# Patient Record
Sex: Male | Born: 1955 | Race: White | Hispanic: No | Marital: Single | Smoking: Never smoker
Health system: Southern US, Community
[De-identification: ages and names within clinical notes are randomized; demographics above are authoritative.]

## PROBLEM LIST (undated history)

## (undated) DIAGNOSIS — Z951 Presence of aortocoronary bypass graft: Secondary | ICD-10-CM

## (undated) DIAGNOSIS — N289 Disorder of kidney and ureter, unspecified: Secondary | ICD-10-CM

## (undated) DIAGNOSIS — R079 Chest pain, unspecified: Secondary | ICD-10-CM

## (undated) DIAGNOSIS — I10 Essential (primary) hypertension: Secondary | ICD-10-CM

## (undated) DIAGNOSIS — I2510 Atherosclerotic heart disease of native coronary artery without angina pectoris: Secondary | ICD-10-CM

## (undated) DIAGNOSIS — N183 Chronic kidney disease, stage 3 (moderate): Secondary | ICD-10-CM

## (undated) DIAGNOSIS — C801 Malignant (primary) neoplasm, unspecified: Secondary | ICD-10-CM

## (undated) HISTORY — PX: CARDIAC SURGERY: SHX584

## (undated) HISTORY — DX: Chronic kidney disease, stage 3 (moderate): N18.3

## (undated) HISTORY — DX: Chest pain, unspecified: R07.9

## (undated) HISTORY — DX: Presence of aortocoronary bypass graft: Z95.1

## (undated) HISTORY — DX: Essential (primary) hypertension: I10

## (undated) HISTORY — PX: NEPHRECTOMY: SHX65

---

## 2015-09-01 DIAGNOSIS — I10 Essential (primary) hypertension: Secondary | ICD-10-CM | POA: Diagnosis present

## 2015-09-01 DIAGNOSIS — N183 Chronic kidney disease, stage 3 unspecified: Secondary | ICD-10-CM | POA: Diagnosis present

## 2015-09-01 HISTORY — DX: Chronic kidney disease, stage 3 unspecified: N18.30

## 2015-09-01 HISTORY — DX: Essential (primary) hypertension: I10

## 2016-04-13 ENCOUNTER — Inpatient Hospital Stay (HOSPITAL_COMMUNITY)
Admission: EM | Admit: 2016-04-13 | Discharge: 2016-04-16 | DRG: 287 | Disposition: A | Discharge status: Home / Self Care | Payer: 59 | Attending: Internal Medicine | Admitting: Internal Medicine

## 2016-04-13 ENCOUNTER — Encounter (HOSPITAL_COMMUNITY): Payer: Self-pay | Admitting: Emergency Medicine

## 2016-04-13 ENCOUNTER — Emergency Department (HOSPITAL_COMMUNITY): Payer: 59

## 2016-04-13 DIAGNOSIS — I2510 Atherosclerotic heart disease of native coronary artery without angina pectoris: Secondary | ICD-10-CM | POA: Diagnosis not present

## 2016-04-13 DIAGNOSIS — R079 Chest pain, unspecified: Secondary | ICD-10-CM

## 2016-04-13 DIAGNOSIS — Z951 Presence of aortocoronary bypass graft: Secondary | ICD-10-CM

## 2016-04-13 DIAGNOSIS — I2583 Coronary atherosclerosis due to lipid rich plaque: Secondary | ICD-10-CM | POA: Diagnosis present

## 2016-04-13 DIAGNOSIS — Z7982 Long term (current) use of aspirin: Secondary | ICD-10-CM

## 2016-04-13 DIAGNOSIS — Z85528 Personal history of other malignant neoplasm of kidney: Secondary | ICD-10-CM

## 2016-04-13 DIAGNOSIS — N183 Chronic kidney disease, stage 3 unspecified: Secondary | ICD-10-CM | POA: Diagnosis present

## 2016-04-13 DIAGNOSIS — R7303 Prediabetes: Secondary | ICD-10-CM | POA: Diagnosis present

## 2016-04-13 DIAGNOSIS — Z7902 Long term (current) use of antithrombotics/antiplatelets: Secondary | ICD-10-CM

## 2016-04-13 DIAGNOSIS — Z8249 Family history of ischemic heart disease and other diseases of the circulatory system: Secondary | ICD-10-CM

## 2016-04-13 DIAGNOSIS — E785 Hyperlipidemia, unspecified: Secondary | ICD-10-CM | POA: Diagnosis present

## 2016-04-13 DIAGNOSIS — I208 Other forms of angina pectoris: Secondary | ICD-10-CM

## 2016-04-13 DIAGNOSIS — I25119 Atherosclerotic heart disease of native coronary artery with unspecified angina pectoris: Secondary | ICD-10-CM | POA: Diagnosis not present

## 2016-04-13 DIAGNOSIS — I129 Hypertensive chronic kidney disease with stage 1 through stage 4 chronic kidney disease, or unspecified chronic kidney disease: Secondary | ICD-10-CM | POA: Diagnosis present

## 2016-04-13 DIAGNOSIS — I10 Essential (primary) hypertension: Secondary | ICD-10-CM | POA: Diagnosis present

## 2016-04-13 DIAGNOSIS — Z79899 Other long term (current) drug therapy: Secondary | ICD-10-CM

## 2016-04-13 DIAGNOSIS — R0789 Other chest pain: Secondary | ICD-10-CM | POA: Diagnosis not present

## 2016-04-13 DIAGNOSIS — E669 Obesity, unspecified: Secondary | ICD-10-CM | POA: Diagnosis present

## 2016-04-13 DIAGNOSIS — I200 Unstable angina: Secondary | ICD-10-CM | POA: Diagnosis not present

## 2016-04-13 DIAGNOSIS — Z905 Acquired absence of kidney: Secondary | ICD-10-CM

## 2016-04-13 DIAGNOSIS — N184 Chronic kidney disease, stage 4 (severe): Secondary | ICD-10-CM | POA: Diagnosis present

## 2016-04-13 HISTORY — DX: Atherosclerotic heart disease of native coronary artery without angina pectoris: I25.10

## 2016-04-13 HISTORY — DX: Disorder of kidney and ureter, unspecified: N28.9

## 2016-04-13 HISTORY — DX: Essential (primary) hypertension: I10

## 2016-04-13 HISTORY — DX: Malignant (primary) neoplasm, unspecified: C80.1

## 2016-04-13 HISTORY — DX: Chest pain, unspecified: R07.9

## 2016-04-13 LAB — CBC
HCT: 41.1 % (ref 39.0–52.0)
Hemoglobin: 14 g/dL (ref 13.0–17.0)
MCH: 31.8 pg (ref 26.0–34.0)
MCHC: 34.1 g/dL (ref 30.0–36.0)
MCV: 93.4 fL (ref 78.0–100.0)
PLATELETS: 141 10*3/uL — AB (ref 150–400)
RBC: 4.4 MIL/uL (ref 4.22–5.81)
RDW: 13.5 % (ref 11.5–15.5)
WBC: 6.3 10*3/uL (ref 4.0–10.5)

## 2016-04-13 LAB — BASIC METABOLIC PANEL
ANION GAP: 8 (ref 5–15)
BUN: 30 mg/dL — ABNORMAL HIGH (ref 6–20)
CALCIUM: 9.2 mg/dL (ref 8.9–10.3)
CO2: 24 mmol/L (ref 22–32)
CREATININE: 2.04 mg/dL — AB (ref 0.61–1.24)
Chloride: 108 mmol/L (ref 101–111)
GFR calc non Af Amer: 33 mL/min — ABNORMAL LOW (ref >60)
GFR, EST AFRICAN AMERICAN: 39 mL/min — AB (ref >60)
Glucose, Bld: 140 mg/dL — ABNORMAL HIGH (ref 65–99)
Potassium: 4.4 mmol/L (ref 3.5–5.1)
Sodium: 140 mmol/L (ref 135–145)

## 2016-04-13 LAB — I-STAT TROPONIN, ED: Troponin i, poc: 0.01 ng/mL (ref 0.00–0.08)

## 2016-04-13 LAB — TROPONIN I

## 2016-04-13 MED ORDER — ACETAMINOPHEN 325 MG PO TABS: 650.0000 mg | ORAL_TABLET | ORAL | Status: DC | PRN

## 2016-04-13 MED ORDER — AMLODIPINE BESYLATE 10 MG PO TABS
10.0000 mg | ORAL_TABLET | Freq: Every day | ORAL | Status: DC
  Administered 2016-04-13 – 2016-04-15 (×3): 10 mg via ORAL
  Filled 2016-04-13: qty 2
  Filled 2016-04-13 (×2): qty 1

## 2016-04-13 MED ORDER — ONDANSETRON HCL 4 MG/2ML IJ SOLN: 4.0000 mg | Freq: Four times a day (QID) | INTRAMUSCULAR | Status: DC | PRN

## 2016-04-13 MED ORDER — METOPROLOL TARTRATE 25 MG PO TABS
100.0000 mg | ORAL_TABLET | Freq: Every day | ORAL | Status: DC
  Administered 2016-04-13: 100 mg via ORAL
  Filled 2016-04-13: qty 4

## 2016-04-13 MED ORDER — METOPROLOL TARTRATE 100 MG PO TABS
100.0000 mg | ORAL_TABLET | Freq: Two times a day (BID) | ORAL | Status: DC
  Administered 2016-04-13 – 2016-04-16 (×7): 100 mg via ORAL
  Filled 2016-04-13 (×4): qty 1

## 2016-04-13 MED ORDER — MAGNESIUM OXIDE 400 (241.3 MG) MG PO TABS
400.0000 mg | ORAL_TABLET | Freq: Two times a day (BID) | ORAL | Status: DC
  Administered 2016-04-13 – 2016-04-16 (×7): 400 mg via ORAL
  Filled 2016-04-13 (×7): qty 1

## 2016-04-13 MED ORDER — ENOXAPARIN SODIUM 40 MG/0.4ML ~~LOC~~ SOLN
40.0000 mg | SUBCUTANEOUS | Status: DC
  Administered 2016-04-14 – 2016-04-15 (×2): 40 mg via SUBCUTANEOUS
  Filled 2016-04-13 (×2): qty 0.4

## 2016-04-13 MED ORDER — ASPIRIN 81 MG PO CHEW
81.0000 mg | CHEWABLE_TABLET | Freq: Every day | ORAL | Status: DC
  Administered 2016-04-14 – 2016-04-16 (×2): 81 mg via ORAL
  Filled 2016-04-13 (×2): qty 1

## 2016-04-13 MED ORDER — CLOPIDOGREL BISULFATE 75 MG PO TABS
75.0000 mg | ORAL_TABLET | Freq: Every day | ORAL | Status: DC
  Administered 2016-04-13 – 2016-04-16 (×4): 75 mg via ORAL
  Filled 2016-04-13 (×4): qty 1

## 2016-04-13 MED ORDER — ASPIRIN 325 MG PO TABS: 325.0000 mg | ORAL_TABLET | Freq: Every day | ORAL | Status: DC

## 2016-04-13 MED ORDER — SODIUM CHLORIDE 0.9 % IV BOLUS (SEPSIS)
1000.0000 mL | Freq: Once | INTRAVENOUS | Status: AC
  Administered 2016-04-13: 1000 mL via INTRAVENOUS

## 2016-04-13 MED ORDER — METOPROLOL TARTRATE 100 MG PO TABS
100.0000 mg | ORAL_TABLET | Freq: Two times a day (BID) | ORAL | Status: DC
  Filled 2016-04-13 (×3): qty 1

## 2016-04-13 MED ORDER — GI COCKTAIL ~~LOC~~: 30.0000 mL | Freq: Four times a day (QID) | ORAL | Status: DC | PRN

## 2016-04-13 MED ORDER — LISINOPRIL 10 MG PO TABS
20.0000 mg | ORAL_TABLET | Freq: Every day | ORAL | Status: DC
  Administered 2016-04-13 – 2016-04-14 (×2): 20 mg via ORAL
  Filled 2016-04-13: qty 2
  Filled 2016-04-13: qty 1

## 2016-04-13 NOTE — ED Notes (Signed)
ED Provider at bedside. Pt will go to X-ray after provider has completed assessment

## 2016-04-13 NOTE — Progress Notes (Signed)
04/13/2016  3:13 AM  04/13/2016 4:14 PM  Erik [name] was seen and examined.  The H&P by the admitting provider , orders, imaging was reviewed.  Please see orders.  Will continue to follow.   Murvin Natal, MD [facility]

## 2016-04-13 NOTE — ED Notes (Signed)
Cardiology at bedside.

## 2016-04-13 NOTE — ED Provider Notes (Signed)
[facility] DEPT Provider Note   CSN: [id number] Arrival date & time: 04/13/16  [id number]     History   Chief Complaint Chief Complaint  Patient presents with  . Chest Pain    HPI Erik [name] is a 60 y.o. male.  HPI  60 y.o. male with a hx of CAD, HTN, presents to the Emergency Department today complaining of mid sternal chest discomfort around 2AM. Pt states that he woke up and felt a pressure in his chest that made him "feel off." states associated dizziness and profuse diaphoresis with heart palpitations. No N/V. No direct chest pain or radiation. Pt does recount previous history of quintuple bypass in 2009 where he felt the same way like he does today. Pt notified EMS and was given ASA and NTG with relief of symptoms. No symptoms currently. Does not endorse any URI symptoms. No ABD pain. No fevers. No other symptoms noted.    Past Medical History:  Diagnosis Date  . Cancer ([place])   . Coronary artery disease   . Hypertension   . Renal disorder     There are no active problems to display for this patient.   Past Surgical History:  Procedure Laterality Date  . CARDIAC SURGERY         Home Medications    Prior to Admission medications   Not on File    Family History History reviewed. No pertinent family history.  Social History Social History  Substance Use Topics  . Smoking status: Never Smoker  . Smokeless tobacco: Never Used  . Alcohol use 1.2 oz/week    2 Shots of liquor per week     Allergies   Patient has no known allergies.   Review of Systems Review of Systems ROS reviewed and all are negative for acute change except as noted in the HPI.  Physical Exam Updated Vital Signs BP 155/87 (BP Location: Right Arm)   Pulse 86   Temp 98.4 F (36.9 C) (Oral)   Resp 17   Ht 6\' 1"  (1.854 m)   Wt 127 kg   SpO2 100%   BMI 36.94 kg/m   Physical Exam  Constitutional: He is oriented to person, place, and time. Vital signs are normal. He appears  well-developed and well-nourished.  HENT:  Head: Normocephalic.  Right Ear: Hearing normal.  Left Ear: Hearing normal.  Eyes: Conjunctivae and EOM are normal. Pupils are equal, round, and reactive to light.  Neck: Normal range of motion. Neck supple.  Cardiovascular: Normal rate, regular rhythm, normal heart sounds and intact distal pulses.   Pulmonary/Chest: Effort normal and breath sounds normal.  Abdominal: Soft. There is no tenderness.  Musculoskeletal: Normal range of motion.  Neurological: He is alert and oriented to person, place, and time.  Skin: Skin is warm and dry.  Psychiatric: He has a normal mood and affect. His speech is normal and behavior is normal. Thought content normal.  Nursing note and vitals reviewed.  ED Treatments / Results  Labs (all labs ordered are listed, but only abnormal results are displayed) Labs Reviewed  CBC - Abnormal; Notable for the following:       Result Value   Platelets 141 (*)    All other components within normal limits  BASIC METABOLIC PANEL - Abnormal; Notable for the following:    Glucose, Bld 140 (*)    BUN 30 (*)    Creatinine, Ser 2.04 (*)    GFR calc non Af Amer 33 (*)  GFR calc Af Amer 39 (*)    All other components within normal limits  I-STAT TROPOININ, ED   EKG  EKG Interpretation  Date/Time:  Wednesday April 13 2016 03:23:27 EST Ventricular Rate:  91 PR Interval:    QRS Duration: 103 QT Interval:  366 QTC Calculation: 451 R Axis:   52 Text Interpretation:  Sinus rhythm Multiple ventricular premature complexes Left atrial enlargement Confirmed by Dina Rich  MD, [place] ([id number]) on 04/13/2016 3:53:06 AM       Radiology Dg Chest 2 View  Result Date: 04/13/2016 CLINICAL DATA:  Chest pressure tonight. EXAM: CHEST  2 VIEW COMPARISON:  None. FINDINGS: Mild cardiomegaly. Prior sternotomy and CABG. The lungs are clear. There is no pleural effusion. The pulmonary vasculature is normal. Hilar and mediastinal contours  are unremarkable. IMPRESSION: Cardiomegaly.  No consolidation or effusion. Electronically Signed   By: Andreas Newport M.D.   On: 04/13/2016 04:07    Procedures Procedures (including critical care time)  Medications Ordered in ED Medications - No data to display   Initial Impression / Assessment and Plan / ED Course  I have reviewed the triage vital signs and the nursing notes.  Pertinent labs & imaging results that were available during my care of the patient were reviewed by me and considered in my medical decision making (see chart for details).  Clinical Course    Final Clinical Impressions(s) / ED Diagnoses  {I have reviewed and evaluated the relevant laboratory values. {I have reviewed and evaluated the relevant imaging studies. {I have interpreted the relevant EKG. {I have reviewed the relevant previous healthcare records. {I have reviewed EMS Documentation. {I obtained HPI from historian. {Patient discussed with supervising physician.  ED Course:  Assessment: Pt is a 60yM presents with chest pressure today. Associated diaphoresis and palpitations. Notes similar hx in past in 2009. Has quintuple bypass done. Risk Factors HTN, Obesity, HLD. Concern for cardiac etiology of Chest Pain. Will admit to observation. Heart Score 5. Pt does not meet criteria for CP protocol and a further evaluation is recommended. Pt has been re-evaluated prior to consult and VSS, NAD, heart RRR, pain 0/10, lungs CTAB. No acute abnormalities found on EKG and first round of cardiac enzymes negative.   Disposition/Plan:  Admit Pt acknowledges and agrees with plan  Supervising Physician Merryl Hacker, MD  Final diagnoses:  Chest pain, unspecified type    New Prescriptions New Prescriptions   No medications on file     Shary Decamp, PA-C 04/13/16 0423    Merryl Hacker, MD 04/13/16 [phone]

## 2016-04-13 NOTE — Consult Note (Signed)
CARDIOLOGY CONSULT NOTE   Patient ID: Erik [name] MRN: [id number] DOB/AGE: 1955/11/26 60 y.o.  Admit date: 04/13/2016  Requesting Physician: Dr. Loleta Books  Primary Physician:   Erik Ellis, NP Primary Cardiologist:  Dr. Shirlee More, [place] Reason for Consultation:   Chest pain  HPI: Erik [name] is a 60 y.o. male with a history of CAD s/p CABG (2009), HTN, HLD, obesity, CKD (baseline creat ~2), and RCC s/p R nephrectomy (2007) who presented to [facility] ED with chest pain.   He has a history of CAD s/p LIMA graft to LAD; Vein grafts to Diag, LCX/OM and RCA in 2009 in the setting of what sounds like [place]. Follow-up coronary angiogram in 2010 revealed patent grafts to the left anterior descending and diagonal vessels. Native right coronary artery and circumflex vessels were patent on that study.  He was seen in the ER in 05/2015 for SOB, cough and palpitations. CXR showed mild bilateral pleural effusions . He was started on lasix with improvement. 2D ECHO was orded but I do not see the result. BNP was normal on 06/04/15 and 06/09/15. Patient said he did have echo and it showed that his heart was strong with an EF of 55%. His nephrologist subsequently took him off lasix given CKD.  He was in his usual state of health until last night when he was woken up from sleep around 2 am. He felt very thirsty and just "didn't feel right." He had some chest pressure and dizziness similar to when he presented in 2009, but actually a little more severe. He called EMS. He was given 1 SL NTG with some relief. He is currently chest pain free. 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 No formal exercise. No LE edema, orthopnea or PND. No syncope. No blood in his stool or urine.     Past Medical History:  Diagnosis Date  . Cancer [facility])    Renal cancer s/p Right nephrectomy  . Coronary artery disease   . Hypertension   . Renal disorder      Past Surgical History:  Procedure  Laterality Date  . CARDIAC SURGERY    . NEPHRECTOMY Right     No Known Allergies  I have reviewed the patient's current medications . amLODipine  10 mg Oral Daily  . aspirin  325 mg Oral Daily  . clopidogrel  75 mg Oral Daily  . enoxaparin (LOVENOX) injection  40 mg Subcutaneous Q24H  . lisinopril  20 mg Oral Daily  . magnesium oxide  400 mg Oral BID  . metoprolol  100 mg Oral Daily    acetaminophen, gi cocktail, ondansetron (ZOFRAN) IV  Prior to Admission medications   Medication Sig Start Date End Date Taking? Authorizing Provider  acetaminophen (TYLENOL) 500 MG tablet Take 500-1,000 mg by mouth every 6 (six) hours as needed (for arthritic pain).   Yes Historical Provider, MD  amLODipine (NORVASC) 10 MG tablet Take 10 mg by mouth daily. 09/29/15  Yes Historical Provider, MD  aspirin 325 MG tablet Take 325 mg by mouth daily.   Yes Historical Provider, MD  Cholecalciferol (VITAMIN D3) 2000 units capsule Take 2,000 Units by mouth daily.   Yes Historical Provider, MD  clopidogrel (PLAVIX) 75 MG tablet Take 75 mg by mouth daily. 07/31/15  Yes Historical Provider, MD  lisinopril (PRINIVIL,ZESTRIL) 20 MG tablet Take 20 mg by mouth daily. 09/09/15  Yes Historical Provider, MD  magnesium oxide (MAG-OX) 400 MG  tablet Take 400 mg by mouth 2 (two) times daily.   Yes Historical Provider, MD  metoprolol (LOPRESSOR) 100 MG tablet Take 100 mg by mouth daily. 02/15/16  Yes Historical Provider, MD     Social History   Social History  . Marital status: Single    Spouse name: N/A  . Number of children: N/A  . Years of education: N/A   Occupational History  . Not on file.   Social History Main Topics  . Smoking status: Never Smoker  . Smokeless tobacco: Never Used  . Alcohol use 1.2 oz/week    2 Shots of liquor per week  . Drug use: No  . Sexual activity: Not on file   Other Topics Concern  . Not on file   Social History Narrative  . No narrative on file    Family Status  Relation  Status  . Father   . Brother    Family History  Problem Relation Age of Onset  . Heart attack Father   . Heart attack Brother 42    ROS:  Full 14 point review of systems complete and found to be negative unless listed above.  Physical Exam: Blood pressure 144/82, pulse 73, temperature 98.4 F (36.9 C), temperature source Oral, resp. rate 14, height 6\' 1"  (1.854 m), weight 280 lb (127 kg), SpO2 97 %.  General: Well developed, well nourished, male in no acute distress, obese Head: Eyes PERRLA, No xanthomas.   Normocephalic and atraumatic, oropharynx without edema or exudate.   Lungs: CTAB Heart: HRRR S1 S2, no rub/gallop, Heart regular rate and rhythm with S1, S2  No murmur. pulses are 2+ extrem.   Neck: No carotid bruits. No lymphadenopathy. No  JVD. Abdomen: post nephrectomy on right  Msk:  No spine or cva tenderness. No weakness, no joint deformities or effusions. Extremities: No clubbing or cyanosis.  No LE edema.  Neuro: Alert and oriented X 3. No focal deficits noted. Psych:  Good affect, responds appropriately Skin: No rashes or lesions noted.  Labs:   Lab Results  Component Value Date   WBC 6.3 04/13/2016   HGB 14.0 04/13/2016   HCT 41.1 04/13/2016   MCV 93.4 04/13/2016   PLT 141 (L) 04/13/2016   No results for input(s): INR in the last 72 hours.  Recent Labs Lab 04/13/16 0340  NA 140  K 4.4  CL 108  CO2 24  BUN 30*  CREATININE 2.04*  CALCIUM 9.2  GLUCOSE 140*   No results found for: MG  Recent Labs  04/13/16 1106  [place] <0.03    Recent Labs  04/13/16 0347  TROPIPOC 0.01     ECG:  NSR with PVCs  Radiology:  Dg Chest 2 View  Result Date: 04/13/2016 CLINICAL DATA:  Chest pressure tonight. EXAM: CHEST  2 VIEW COMPARISON:  None. FINDINGS: Mild cardiomegaly. Prior sternotomy and CABG. The lungs are clear. There is no pleural effusion. The pulmonary vasculature is normal. Hilar and mediastinal contours are unremarkable. IMPRESSION:  Cardiomegaly.  No consolidation or effusion. Electronically Signed   By: Andreas Newport M.D.   On: 04/13/2016 04:07    ASSESSMENT AND PLAN:    Principal Problem:   Chest pain Active Problems:   Essential hypertension with goal blood pressure less than 130/85   CKD (chronic kidney disease) stage 3, GFR 30-59 ml/min   Coronary artery disease due to lipid rich plaque  Adren Bissonnette is a 60 y.o. male with a history of CAD s/p CABG (  2009), HTN, HLD, obesity, CKD (baseline creat ~2) and RCC s/p R nephrectomy (2007) who presented to [facility] ED with chest pain.   Chest pain: troponin neg x1. ECG with no acute ST/TW changes. With his CKD with creat around 2, plan will be to start with exercise myoview tomorrow AM. Will hold BB and make NPO after midnight.   CAD s/p CABG: continue ASA (decrease from 325mg  --> 81mg )/plavix, statin and BB. Will increase lopressor to 100mg  BID (only taking once daily?) after stress test.   HLD: LDL 102 in 5/017. Continue Crestor  Pre diabetes: Hg A1c 6.4 in 08/2015.  CKD s/p nephrectomy: creat at baseline. Will need to hydrate if he needs a cath   HTN: continue amlodipine, lisinopril and lopressor for now. Hold BB for stress test in AM. If he needs cath we will need to hold lisinopril as well.   Signed: Angelena Form, PA-C 04/13/2016 1:05 PM  Pager [phone]  Co-Sign MD  Patient examined chart reviewed. Obese white male post sternotomy Lungs clear no murmur or edema Atypical pain with negative troponin and no ECG changes Given nephrectomy and Cr 2 like to avoid Contrast/cath Patient thinks he can walk on treadmill Will order exercise myovue in am and only consider Cath for high risk scan.  Continue ASA increase lopressor  NPO in am  Jenkins Rouge

## 2016-04-13 NOTE — ED Notes (Signed)
Attempted report 

## 2016-04-13 NOTE — H&P (Signed)
History and Physical  Patient Name: Erik [name]     QZE:[id number]    DOB: [birth year]    DOA: 04/13/2016 PCP: Alfonso Ellis, NP  Cardiologist: Dr. Shirlee More, [place]    Patient coming from: Home     Chief Complaint: Chest pain  HPI: Sutter Ahlgren is a 60 y.o. 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  Patient was in his usual state of health until tonight at 2 AM, he woke up "not feeling right". He got up and sat at his desk, and then had onset of "heart pounding", then started to be diaphoretic, felt a pressure in his chest, was breathing fast, and felt like he was dizzy and going to pass out. HIS very similar to the episode he had back in 2009 pound and be evaluated before his CABG (he thinks this was not a "heart attack" per se though).  He did have two whiskey cokes before bed tonight, which is unusual for him.  He called 9-1-1 and took aspirin 325 mg twice and shortly after that, his pain resolved (not after nitro as noted in other notes).  ED course: -Afebrile, heart rate 86, respirations and pulse ox normal, blood pressure 155/87 -Initial ECG showed sinus tachycardia wtihout ST changes, rare PVCs and troponin was negative. -Na 140, K 4.4, Cr 2.04 (baseline 2.0), WBC 6.3, Hgb 14 -CXR showed cardiomegaly without airspace disease or edema -TRH was asked to admit for observation, serial troponins and risk stratification.   Risk factors: Existing premature coronary disease, premature CAD in family, HTN, hyperlipidemia intolerant of statins.   No DM or smoking.      Review of Systems:  Review of Systems  Constitutional: Positive for diaphoresis.  Respiratory: Positive for shortness of breath.   Cardiovascular: Positive for chest pain and palpitations.  Neurological: Positive for dizziness.  Psychiatric/Behavioral: The patient is nervous/anxious.   All other  systems reviewed and are negative.    Past Medical History:  Diagnosis Date  . Cancer [facility])    Renal cancer s/p Right nephrectomy  . Coronary artery disease   . Hypertension   . Renal disorder     Past Surgical History:  Procedure Laterality Date  . CARDIAC SURGERY    . NEPHRECTOMY Right     Social History: Patient lives alone.  Patient walks unassisted.  [place] on [place]  Drives a motorcycle.  Never smoker.  Drinks rarely.    No Known Allergies  Family history: family history includes Heart attack in his father; Heart attack (age of onset: 40) in his brother.  Prior to Admission medications   Not on File       Physical Exam: BP 166/97   Pulse 80   Temp 98.4 F (36.9 C) (Oral)   Resp 17   Ht 6' 1"  (1.854 m)   Wt 127 kg (280 lb)   SpO2 99%   BMI 36.94 kg/m  General appearance: Well-developed, adult male, alert and in no acute distress.   Eyes: Anicteric, conjunctiva pink, lids and lashes normal.     ENT: No nasal deformity, discharge, or epistaxis.  OP moist without lesions.  Dentures. Skin: Warm and dry.   Cardiac: RRR, nl S1-S2, no murmurs appreciated.  Capillary refill is brisk.  JVP not visible.  No LE edema.  Radial and DP pulses 2+ and symmetric.  No carotid  bruits. Respiratory: Normal respiratory rate and rhythm.  CTAB without rales or wheezes. GI: Abdomen soft without rigidity.  No TTP. No ascites, distension.   MSK: No deformities or effusions.   Pain not reproduced with palpation of precordium.  No pain with arm movement. Neuro: Sensorium intact and responding to questions, attention normal.  Speech is fluent.  Moves all extremities equally and with normal coordination.    Psych: Behavior appropriate.  Affect normal.  No evidence of aural or visual hallucinations or delusions.       Labs on Admission:  The metabolic panel shows chronic renal insufficiency, at baseline Cr 2.0, normal electrolytes. The complete blood count shows no  leukocytosis anemia or thrombocytopenia. The initial troponin is negative.  Radiological Exams on Admission: Personally reviewed CXR shows CM and no airspace disease: Dg Chest 2 View  Result Date: 04/13/2016 CLINICAL DATA:  Chest pressure tonight. EXAM: CHEST  2 VIEW COMPARISON:  None. FINDINGS: Mild cardiomegaly. Prior sternotomy and CABG. The lungs are clear. There is no pleural effusion. The pulmonary vasculature is normal. Hilar and mediastinal contours are unremarkable. IMPRESSION: Cardiomegaly.  No consolidation or effusion. Electronically Signed   By: Andreas Newport M.D.   On: 04/13/2016 04:07    EKG: Independently reviewed. Rate 91, QTc, 451, a few PVCs.  No ST changes.    Assessment/Plan  1. Chest pain: This is new.  The patient has HEART score of 4. Symptoms are similar to a previous episode before this CABG.  Other potential causes of chest pain (PE, dissection, pancreatitis, pneumonia/effusion, pericarditis) are doubted.  Intense palpitations and alcohol use perhaps were all from symptomatic PVCs?   -Serial troponins are ordered -Telemetry -Consult to cardiology, appreciate recommendations    2. HTN and CAD secondary prevention:  Intolerant to statins, including every other day dosing.  Never tried pravastatin. -Continue lisinopril, BB, and amlodipine -Continue aspirin 325 mg and Plavix  3. CKD III-IV:  Baseline Cr 2.0, eGFR 33. Stable.  4. Other medications:  -Continue magnesium            DVT prophylaxis: Lovenox Diet: NPO after 4am for anticipated stress testing Code Status: FULL  Family Communication: None present  Disposition Plan: Anticipate overnight observation for arrhythmia on telemetry, serial troponins and subsequent risk stratification by Cardiology.  If testing negative, home after. Consults called: Cardiology, via Inbasket Admission status: Telemetry, OBS   Medical decision making: Patient seen at 5:10 AM on 04/13/2016.  The  patient was discussed with Shary Decamp, PA-C. What exists of the patient's chart from outside records at [facility] was reviewed in depth.  Clinical condition: stable.      Edwin Dada [facility] Pager [phone]

## 2016-04-13 NOTE — ED Triage Notes (Signed)
Per EMS, pt woke up to get a drink of water, began to have midsternal non-radiating chest pain.  He became diaphoric, SOB, felt as if he was "going to pass out" and describes his heart as "pounding away".  Pt took 650 of ASA and called for EMS.  He was given a nitro in route which took his pressure from "bad" to "gone".  Extensive cardiac hx.

## 2016-04-14 ENCOUNTER — Observation Stay (HOSPITAL_COMMUNITY): Payer: 59

## 2016-04-14 DIAGNOSIS — R079 Chest pain, unspecified: Secondary | ICD-10-CM

## 2016-04-14 DIAGNOSIS — R0789 Other chest pain: Secondary | ICD-10-CM | POA: Diagnosis not present

## 2016-04-14 DIAGNOSIS — I209 Angina pectoris, unspecified: Secondary | ICD-10-CM | POA: Diagnosis not present

## 2016-04-14 DIAGNOSIS — I2510 Atherosclerotic heart disease of native coronary artery without angina pectoris: Secondary | ICD-10-CM | POA: Diagnosis not present

## 2016-04-14 DIAGNOSIS — N183 Chronic kidney disease, stage 3 (moderate): Secondary | ICD-10-CM | POA: Diagnosis not present

## 2016-04-14 DIAGNOSIS — I10 Essential (primary) hypertension: Secondary | ICD-10-CM | POA: Diagnosis not present

## 2016-04-14 LAB — NM MYOCAR MULTI W/SPECT W/WALL MOTION / EF
CHL CUP NUCLEAR SDS: 8
CHL CUP NUCLEAR SRS: 4
CHL CUP NUCLEAR SSS: 12
CSEPPHR: 116 {beats}/min
Estimated workload: 1 METS
LHR: 0.46
LV sys vol: 80 mL
LVDIAVOL: 153 mL (ref 62–150)
MPHR: 159 {beats}/min
NUC STRESS TID: 1.15
Percent HR: 72 %
Rest HR: 66 {beats}/min

## 2016-04-14 LAB — PROTIME-INR
INR: 0.94
Prothrombin Time: 12.5 seconds (ref 11.4–15.2)

## 2016-04-14 MED ORDER — REGADENOSON 0.4 MG/5ML IV SOLN
INTRAVENOUS | Status: AC
  Filled 2016-04-14: qty 5

## 2016-04-14 MED ORDER — SODIUM CHLORIDE 0.9% FLUSH: 3.0000 mL | INTRAVENOUS | Status: DC | PRN

## 2016-04-14 MED ORDER — SODIUM CHLORIDE 0.9 % IV SOLN: INTRAVENOUS | Status: DC

## 2016-04-14 MED ORDER — REGADENOSON 0.4 MG/5ML IV SOLN
0.4000 mg | Freq: Once | INTRAVENOUS | Status: AC
  Administered 2016-04-14: 0.4 mg via INTRAVENOUS
  Filled 2016-04-14: qty 5

## 2016-04-14 MED ORDER — SODIUM CHLORIDE 0.9 % IV SOLN
INTRAVENOUS | Status: DC
  Administered 2016-04-14: 21:00:00 via INTRAVENOUS

## 2016-04-14 MED ORDER — ALPRAZOLAM 0.5 MG PO TABS
0.5000 mg | ORAL_TABLET | Freq: Three times a day (TID) | ORAL | Status: DC | PRN
  Administered 2016-04-14 – 2016-04-15 (×2): 0.5 mg via ORAL
  Filled 2016-04-14 (×2): qty 1

## 2016-04-14 MED ORDER — TECHNETIUM TC 99M TETROFOSMIN IV KIT
10.0000 | PACK | Freq: Once | INTRAVENOUS | Status: AC | PRN
  Administered 2016-04-14: 10 via INTRAVENOUS

## 2016-04-14 MED ORDER — ASPIRIN 81 MG PO CHEW
324.0000 mg | CHEWABLE_TABLET | ORAL | Status: AC
  Administered 2016-04-15: 324 mg via ORAL
  Filled 2016-04-14: qty 4

## 2016-04-14 MED ORDER — SODIUM CHLORIDE 0.9% FLUSH
3.0000 mL | Freq: Two times a day (BID) | INTRAVENOUS | Status: DC
  Administered 2016-04-14 – 2016-04-15 (×2): 3 mL via INTRAVENOUS

## 2016-04-14 MED ORDER — TECHNETIUM TC 99M TETROFOSMIN IV KIT
30.0000 | PACK | Freq: Once | INTRAVENOUS | Status: AC | PRN
  Administered 2016-04-14: 30 via INTRAVENOUS

## 2016-04-14 NOTE — Progress Notes (Addendum)
The patient was seen in nuclear medicine for an [employer]. He tolerated the procedure well. No acute ST or TW changes on ECG.   Switched from treadmill to [place], NP  [place]

## 2016-04-14 NOTE — Progress Notes (Signed)
Long discussion with patient High risk myovue with TID and anterior anterior lateral apical ischemia  Reviewed notes her brought Had cath after CABG in 2010  LIMA patent to LAD SVG D1 40% SVG RCA occluded SVG OM1/OM3 sequential occluded  Started hydrating 3:30 today PA to write pre cath orders including bicarb Cath in am   Interventionalist will decide if left wrist or RFA to minimize dye Needs echo for EF had one 06/2015 Mikel Cella but cannot find  Results in care everywhere  Jenkins Rouge

## 2016-04-14 NOTE — Progress Notes (Signed)
Patient Name: Erik [name] Date of Encounter: 04/14/2016  Primary Cardiologist: Dr. Shirlee More, [facility] Problem List     Principal Problem:   Chest pain Active Problems:   Essential hypertension with goal blood pressure less than 130/85   CKD (chronic kidney disease) stage 3, GFR 30-59 ml/min   Coronary artery disease due to lipid rich plaque     Subjective   Feels well, denies chest pain and SOB. Seen in nuclear med  Inpatient Medications    Scheduled Meds: . amLODipine  10 mg Oral Daily  . aspirin  81 mg Oral Daily  . clopidogrel  75 mg Oral Daily  . enoxaparin (LOVENOX) injection  40 mg Subcutaneous Q24H  . lisinopril  20 mg Oral Daily  . magnesium oxide  400 mg Oral BID  . metoprolol  100 mg Oral BID  . metoprolol  100 mg Oral BID  . regadenoson  0.4 mg Intravenous Once   Continuous Infusions:  PRN Meds: acetaminophen, gi cocktail, ondansetron (ZOFRAN) IV   Vital Signs    Vitals:   04/13/16 1412 04/13/16 2025 04/14/16 0550 04/14/16 0850  BP: (!) 150/91 (!) 153/84 123/66 132/79  Pulse: 68 65 68   Resp:  20 18   Temp:  97.8 F (36.6 C) 98.4 F (36.9 C)   TempSrc:  Oral Oral   SpO2: 97% 98% 95%   Weight:      Height:        Intake/Output Summary (Last 24 hours) at 04/14/16 0916 Last data filed at 04/14/16 0851  Gross per 24 hour  Intake                0 ml  Output                0 ml  Net                0 ml   Filed Weights   04/13/16 0331  Weight: 280 lb (127 kg)    Physical Exam   GEN: Well nourished, well developed, in no acute distress.  HEENT: Grossly normal.  Neck: Supple, no JVD, carotid bruits, or masses. Cardiac: RRR, no murmurs, rubs, or gallops. No clubbing, cyanosis, edema.  Radials/DP/PT 2+ and equal bilaterally.  Respiratory:  Respirations regular and unlabored, clear to auscultation bilaterally. GI: Soft, nontender, nondistended, BS + x 4. MS: no deformity or atrophy. Skin: warm and dry, no rash. Neuro:   Strength and sensation are intact. Psych: AAOx3.  Normal affect.  Labs    CBC  Recent Labs  04/13/16 0340  WBC 6.3  HGB 14.0  HCT 41.1  MCV 93.4  PLT [id number]*   Basic Metabolic Panel  Recent Labs  04/13/16 0340  NA 140  K 4.4  CL 108  CO2 24  GLUCOSE 140*  BUN 30*  CREATININE 2.04*  CALCIUM 9.2    Cardiac Enzymes  Recent Labs  04/13/16 1106 04/13/16 1442  TROPONINI <0.03 <0.03     Telemetry    NSR - Personally Reviewed  ECG    NSR - Personally Reviewed  Radiology    Dg Chest 2 View  Result Date: 04/13/2016 CLINICAL DATA:  Chest pressure tonight. EXAM: CHEST  2 VIEW COMPARISON:  None. FINDINGS: Mild cardiomegaly. Prior sternotomy and CABG. The lungs are clear. There is no pleural effusion. The pulmonary vasculature is normal. Hilar and mediastinal contours are unremarkable. IMPRESSION: Cardiomegaly.  No consolidation or effusion. Electronically Signed   By: Quillian Quince  Armandina Stammer M.D.   On: 04/13/2016 04:07      Patient Profile  Erik [name] is a 60 y.o. male with a history of CAD s/p CABG (2009), HTN, HLD, obesity, CKD (baseline creat ~2) and RCC s/p R nephrectomy (2007) who presented to [facility] ED with chest pain.      Assessment & Plan    Chest pain: troponin neg x3. ECG with no acute ST/TW changes. With his CKD with creat around 2, risk Stratification with myovue today will need hydration if cath planned for positive scan  CAD s/p CABG: continue ASA (decrease from 325mg  --> 81mg )/plavix, statin and BB. Will increase lopressor to 100mg  BID (only taking once daily?) after stress test.   HLD: LDL 102 in 5/017. Continue Crestor  Pre diabetes: Hg A1c 6.4 in 08/2015.  CKD s/p nephrectomy: creat at baseline. Will need to hydrate if he needs a cath   HTN: continue amlodipine, lisinopril and lopressor for now.    Jenkins Rouge

## 2016-04-14 NOTE — Progress Notes (Signed)
PROGRESS NOTE   Erik [name]  [id number]  DOB: June 02, 1955  DOA: 04/13/2016 PCP: Alfonso Ellis, NP  Hospital course:  Erik [name] is a 60 y.o. 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  Assessment & Plan:   1. Chest pain: This is new.  The patient has HEART score of 4. Symptoms are similar to a previous episode before this CABG.  Other potential causes of chest pain (PE, dissection, pancreatitis, pneumonia/effusion, pericarditis) are doubted.  Intense palpitations and alcohol use perhaps were all from symptomatic PVCs?   -Serial troponins are negative, stress myoview intermediate -Telemetry monitoring -Consult to cardiology, appreciate recommendations  2. HTN and CAD secondary prevention:  Intolerant to statins, including every other day dosing.  Never tried pravastatin. -Continue lisinopril, BB, and amlodipine -Continue aspirin 325 mg and Plavix  3. CKD III-IV:  Baseline Cr 2.0, eGFR 33. Stable.  4. Other medications:  -Continue magnesium  DVT prophylaxis: Lovenox Code Status: FULL  Family Communication: None present  Disposition Plan: Home  Consults called: Cardiology, via Inbasket  Subjective: Pt reports no more chest pain  Objective: Vitals:   04/14/16 0926 04/14/16 0928 04/14/16 0930 04/14/16 1151  BP: (!) 150/75 140/78 (!) 141/73 133/83  Pulse:    76  Resp:    18  Temp:    98.4 F (36.9 C)  TempSrc:    Oral  SpO2:    96%  Weight:      Height:        Intake/Output Summary (Last 24 hours) at 04/14/16 1451 Last data filed at 04/14/16 0851  Gross per 24 hour  Intake                0 ml  Output                0 ml  Net                0 ml   Filed Weights   04/13/16 0331  Weight: 127 kg (280 lb)    Exam:  General exam: awake, alert, no distress, cooperative.  Respiratory system: Clear. No increased work of  breathing. Cardiovascular system: S1 & S2 heard. No JVD, murmurs, gallops, clicks or pedal edema. Gastrointestinal system: Abdomen is nondistended, soft and nontender. Normal bowel sounds heard. Central nervous system: Alert and oriented. No focal neurological deficits. Extremities: no CCE.  Data Reviewed: Basic Metabolic Panel:  Recent Labs Lab 04/13/16 0340  NA 140  K 4.4  CL 108  CO2 24  GLUCOSE 140*  BUN 30*  CREATININE 2.04*  CALCIUM 9.2   Liver Function Tests: No results for input(s): AST, ALT, ALKPHOS, BILITOT, PROT, ALBUMIN in the last 168 hours. No results for input(s): LIPASE, AMYLASE in the last 168 hours. No results for input(s): AMMONIA in the last 168 hours. CBC:  Recent Labs Lab 04/13/16 0340  WBC 6.3  HGB 14.0  HCT 41.1  MCV 93.4  PLT 141*   Cardiac Enzymes:  Recent Labs Lab 04/13/16 1106 04/13/16 1442  TROPONINI <0.03 <0.03   CBG (last 3)  No results for input(s): GLUCAP in the last 72 hours. No results found for this or any previous visit (from the past 240 hour(s)).   Studies: Dg Chest 2 View  Result Date: 04/13/2016 CLINICAL DATA:  Chest pressure tonight. EXAM: CHEST  2 VIEW COMPARISON:  None. FINDINGS: Mild  cardiomegaly. Prior sternotomy and CABG. The lungs are clear. There is no pleural effusion. The pulmonary vasculature is normal. Hilar and mediastinal contours are unremarkable. IMPRESSION: Cardiomegaly.  No consolidation or effusion. Electronically Signed   By: Andreas Newport M.D.   On: 04/13/2016 04:07   Nm Myocar Multi W/spect W/wall Motion / Ef  Result Date: 04/14/2016  Horizontal ST segment depression ST segment depression of 3 mm was noted during stress in the V4, V5, V6 and V3 leads, beginning at 1 minutes of stress, ending at 5 minutes of stress, and returning to baseline after less than 1 minute of recovery.  Defect 1: There is a large defect of moderate severity.  Findings consistent with ischemia.  This is an  intermediate risk study.  Nuclear stress EF: 47%.  Large size, moderate severity revesrible anterior, anteroapical, anterolateral and anteroseptal wall defect suggestive of ischemia. LVEF 47% with global hypokinesis. This is an intermediate risk study.   Scheduled Meds: . regadenoson      . amLODipine  10 mg Oral Daily  . aspirin  81 mg Oral Daily  . clopidogrel  75 mg Oral Daily  . enoxaparin (LOVENOX) injection  40 mg Subcutaneous Q24H  . lisinopril  20 mg Oral Daily  . magnesium oxide  400 mg Oral BID  . metoprolol  100 mg Oral BID  . metoprolol  100 mg Oral BID   Principal Problem:   Chest pain Active Problems:   Essential hypertension with goal blood pressure less than 130/85   CKD (chronic kidney disease) stage 3, GFR 30-59 ml/min   Coronary artery disease due to lipid rich plaque  Time spent:   Irwin Brakeman, MD, FAAFP [facility] Pager [phone]  If 7PM-7AM, please contact night-coverage [email or web address] Password [email or web address] 04/14/2016, 2:51 PM    LOS: 0 days

## 2016-04-15 ENCOUNTER — Observation Stay (HOSPITAL_BASED_OUTPATIENT_CLINIC_OR_DEPARTMENT_OTHER): Payer: 59

## 2016-04-15 ENCOUNTER — Encounter (HOSPITAL_COMMUNITY)
Admission: EM | Disposition: A | Discharge status: Home / Self Care | Payer: Self-pay | Source: Home / Self Care | Attending: Family Medicine

## 2016-04-15 DIAGNOSIS — I2583 Coronary atherosclerosis due to lipid rich plaque: Secondary | ICD-10-CM | POA: Diagnosis present

## 2016-04-15 DIAGNOSIS — R079 Chest pain, unspecified: Secondary | ICD-10-CM

## 2016-04-15 DIAGNOSIS — Z7902 Long term (current) use of antithrombotics/antiplatelets: Secondary | ICD-10-CM | POA: Diagnosis not present

## 2016-04-15 DIAGNOSIS — I25119 Atherosclerotic heart disease of native coronary artery with unspecified angina pectoris: Secondary | ICD-10-CM | POA: Diagnosis present

## 2016-04-15 DIAGNOSIS — I200 Unstable angina: Secondary | ICD-10-CM | POA: Diagnosis not present

## 2016-04-15 DIAGNOSIS — I2510 Atherosclerotic heart disease of native coronary artery without angina pectoris: Secondary | ICD-10-CM | POA: Diagnosis not present

## 2016-04-15 DIAGNOSIS — N184 Chronic kidney disease, stage 4 (severe): Secondary | ICD-10-CM | POA: Diagnosis present

## 2016-04-15 DIAGNOSIS — I209 Angina pectoris, unspecified: Secondary | ICD-10-CM

## 2016-04-15 DIAGNOSIS — I10 Essential (primary) hypertension: Secondary | ICD-10-CM | POA: Diagnosis not present

## 2016-04-15 DIAGNOSIS — Z7982 Long term (current) use of aspirin: Secondary | ICD-10-CM | POA: Diagnosis not present

## 2016-04-15 DIAGNOSIS — E785 Hyperlipidemia, unspecified: Secondary | ICD-10-CM

## 2016-04-15 DIAGNOSIS — I129 Hypertensive chronic kidney disease with stage 1 through stage 4 chronic kidney disease, or unspecified chronic kidney disease: Secondary | ICD-10-CM | POA: Diagnosis present

## 2016-04-15 DIAGNOSIS — R7303 Prediabetes: Secondary | ICD-10-CM | POA: Diagnosis present

## 2016-04-15 DIAGNOSIS — Z85528 Personal history of other malignant neoplasm of kidney: Secondary | ICD-10-CM | POA: Diagnosis not present

## 2016-04-15 DIAGNOSIS — Z8249 Family history of ischemic heart disease and other diseases of the circulatory system: Secondary | ICD-10-CM | POA: Diagnosis not present

## 2016-04-15 DIAGNOSIS — N183 Chronic kidney disease, stage 3 (moderate): Secondary | ICD-10-CM | POA: Diagnosis not present

## 2016-04-15 DIAGNOSIS — Z951 Presence of aortocoronary bypass graft: Secondary | ICD-10-CM | POA: Diagnosis not present

## 2016-04-15 DIAGNOSIS — Z79899 Other long term (current) drug therapy: Secondary | ICD-10-CM | POA: Diagnosis not present

## 2016-04-15 DIAGNOSIS — E669 Obesity, unspecified: Secondary | ICD-10-CM | POA: Diagnosis present

## 2016-04-15 DIAGNOSIS — Z905 Acquired absence of kidney: Secondary | ICD-10-CM | POA: Diagnosis not present

## 2016-04-15 HISTORY — PX: CARDIAC CATHETERIZATION: SHX172

## 2016-04-15 LAB — ECHOCARDIOGRAM COMPLETE
EERAT: 6.49
EWDT: 254 ms
FS: 39 % (ref 28–44)
Height: 73 in
IV/PV OW: 0.97
LA ID, A-P, ES: 52 mm
LA diam index: 2.11 cm/m2
LAVOL: 73.5 mL
LAVOLA4C: 61.3 mL
LAVOLIN: 29.9 mL/m2
LEFT ATRIUM END SYS DIAM: 52 mm
LV E/e'average: 6.49
LV PW d: 14.8 mm — AB (ref 0.6–1.1)
LV TDI E'MEDIAL: 6.09
LV e' LATERAL: 10.7 cm/s
LVEEMED: 6.49
LVOT area: 4.91 cm2
LVOT diameter: 25 mm
Lateral S' vel: 9.25 cm/s
MV Dec: 254
MV pk E vel: 69.4 m/s
MVPKAVEL: 66.5 m/s
RV TAPSE: 14.1 mm
Reg peak vel: 225 cm/s
TDI e' lateral: 10.7
TR max vel: 225 cm/s
Weight: 4368 oz

## 2016-04-15 LAB — CBC
HCT: 46 % (ref 39.0–52.0)
Hemoglobin: 15.8 g/dL (ref 13.0–17.0)
MCH: 32.7 pg (ref 26.0–34.0)
MCHC: 34.3 g/dL (ref 30.0–36.0)
MCV: 95.2 fL (ref 78.0–100.0)
PLATELETS: 156 10*3/uL (ref 150–400)
RBC: 4.83 MIL/uL (ref 4.22–5.81)
RDW: 13.6 % (ref 11.5–15.5)
WBC: 7.3 10*3/uL (ref 4.0–10.5)

## 2016-04-15 LAB — LIPID PANEL
CHOL/HDL RATIO: 5.4 ratio
Cholesterol: 214 mg/dL — ABNORMAL HIGH (ref 0–200)
HDL: 40 mg/dL — ABNORMAL LOW (ref >40)
LDL CALC: 117 mg/dL — AB (ref 0–99)
Triglycerides: 283 mg/dL — ABNORMAL HIGH (ref <150)
VLDL: 57 mg/dL — AB (ref 0–40)

## 2016-04-15 LAB — BASIC METABOLIC PANEL
Anion gap: 6 (ref 5–15)
BUN: 22 mg/dL — AB (ref 6–20)
CALCIUM: 9.6 mg/dL (ref 8.9–10.3)
CO2: 27 mmol/L (ref 22–32)
CREATININE: 1.9 mg/dL — AB (ref 0.61–1.24)
Chloride: 105 mmol/L (ref 101–111)
GFR calc Af Amer: 42 mL/min — ABNORMAL LOW (ref >60)
GFR, EST NON AFRICAN AMERICAN: 36 mL/min — AB (ref >60)
Glucose, Bld: 97 mg/dL (ref 65–99)
Potassium: 4.7 mmol/L (ref 3.5–5.1)
SODIUM: 138 mmol/L (ref 135–145)

## 2016-04-15 LAB — PLATELET INHIBITION P2Y12: PLATELET FUNCTION P2Y12: 183 [PRU] — AB (ref 194–418)

## 2016-04-15 SURGERY — LEFT HEART CATH AND CORS/GRAFTS ANGIOGRAPHY
Anesthesia: LOCAL

## 2016-04-15 MED ORDER — CLOPIDOGREL BISULFATE 75 MG PO TABS: 75.0000 mg | ORAL_TABLET | Freq: Every day | ORAL | Status: DC

## 2016-04-15 MED ORDER — MORPHINE SULFATE (PF) 2 MG/ML IV SOLN: 2.0000 mg | INTRAVENOUS | Status: DC | PRN

## 2016-04-15 MED ORDER — MIDAZOLAM HCL 2 MG/2ML IJ SOLN
INTRAMUSCULAR | Status: DC | PRN
  Administered 2016-04-15: 1 mg via INTRAVENOUS

## 2016-04-15 MED ORDER — SODIUM CHLORIDE 0.9 % IV SOLN: 250.0000 mL | INTRAVENOUS | Status: DC | PRN

## 2016-04-15 MED ORDER — SODIUM CHLORIDE 0.9% FLUSH: 3.0000 mL | INTRAVENOUS | Status: DC | PRN

## 2016-04-15 MED ORDER — HEPARIN (PORCINE) IN NACL 2-0.9 UNIT/ML-% IJ SOLN
INTRAMUSCULAR | Status: DC | PRN
  Administered 2016-04-15: 1000 mL

## 2016-04-15 MED ORDER — IOPAMIDOL (ISOVUE-370) INJECTION 76%
INTRAVENOUS | Status: DC | PRN
  Administered 2016-04-15: 70 mL via INTRA_ARTERIAL

## 2016-04-15 MED ORDER — FENTANYL CITRATE (PF) 100 MCG/2ML IJ SOLN
INTRAMUSCULAR | Status: AC
  Filled 2016-04-15: qty 2

## 2016-04-15 MED ORDER — MIDAZOLAM HCL 2 MG/2ML IJ SOLN
INTRAMUSCULAR | Status: AC
  Filled 2016-04-15: qty 2

## 2016-04-15 MED ORDER — ASPIRIN 81 MG PO CHEW: 81.0000 mg | CHEWABLE_TABLET | Freq: Every day | ORAL | Status: DC

## 2016-04-15 MED ORDER — ACETAMINOPHEN 325 MG PO TABS: 650.0000 mg | ORAL_TABLET | ORAL | Status: DC | PRN

## 2016-04-15 MED ORDER — ONDANSETRON HCL 4 MG/2ML IJ SOLN: 4.0000 mg | Freq: Four times a day (QID) | INTRAMUSCULAR | Status: DC | PRN

## 2016-04-15 MED ORDER — LIDOCAINE HCL (PF) 1 % IJ SOLN
INTRAMUSCULAR | Status: DC | PRN
  Administered 2016-04-15: 15 mL

## 2016-04-15 MED ORDER — LIDOCAINE HCL (PF) 1 % IJ SOLN
INTRAMUSCULAR | Status: AC
  Filled 2016-04-15: qty 30

## 2016-04-15 MED ORDER — FENTANYL CITRATE (PF) 100 MCG/2ML IJ SOLN
INTRAMUSCULAR | Status: DC | PRN
  Administered 2016-04-15: 25 ug via INTRAVENOUS

## 2016-04-15 MED ORDER — SODIUM CHLORIDE 0.9 % IV SOLN: INTRAVENOUS | Status: AC

## 2016-04-15 MED ORDER — SODIUM CHLORIDE 0.9% FLUSH
3.0000 mL | Freq: Two times a day (BID) | INTRAVENOUS | Status: DC
  Administered 2016-04-15: 3 mL via INTRAVENOUS

## 2016-04-15 MED ORDER — SODIUM BICARBONATE 8.4 % IV SOLN
INTRAVENOUS | Status: AC
  Administered 2016-04-15: 15:00:00 via INTRAVENOUS
  Filled 2016-04-15 (×2): qty 500

## 2016-04-15 MED ORDER — IOPAMIDOL (ISOVUE-370) INJECTION 76%
INTRAVENOUS | Status: AC
Start: 2016-04-15 — End: 2016-04-15
  Filled 2016-04-15: qty 100

## 2016-04-15 MED ORDER — SODIUM BICARBONATE BOLUS VIA INFUSION
INTRAVENOUS | Status: AC
  Administered 2016-04-15: 75 meq via INTRAVENOUS
  Filled 2016-04-15: qty 1

## 2016-04-15 MED ORDER — HEPARIN (PORCINE) IN NACL 2-0.9 UNIT/ML-% IJ SOLN
INTRAMUSCULAR | Status: AC
  Filled 2016-04-15: qty 1000

## 2016-04-15 MED ORDER — SODIUM BICARBONATE 8.4 % IV SOLN
INTRAVENOUS | Status: AC
  Administered 2016-04-15: 20:00:00 via INTRAVENOUS
  Filled 2016-04-15 (×2): qty 1000

## 2016-04-15 SURGICAL SUPPLY — 7 items
CATH INFINITI 5FR MULTPACK ANG (CATHETERS) ×2 IMPLANT
HOVERMATT SINGLE USE (MISCELLANEOUS) ×2 IMPLANT
KIT HEART LEFT (KITS) ×2 IMPLANT
PACK CARDIAC CATHETERIZATION (CUSTOM PROCEDURE TRAY) ×2 IMPLANT
SHEATH PINNACLE 5F 10CM (SHEATH) ×2 IMPLANT
TRANSDUCER W/STOPCOCK (MISCELLANEOUS) ×2 IMPLANT
WIRE EMERALD 3MM-J .035X150CM (WIRE) ×2 IMPLANT

## 2016-04-15 NOTE — Progress Notes (Signed)
Patient Name: Erik [name] Date of Encounter: 04/15/2016  Primary Cardiologist: Dr. Shirlee More, [facility] Problem List     Principal Problem:   Chest pain Active Problems:   Essential hypertension with goal blood pressure less than 130/85   CKD (chronic kidney disease) stage 3, GFR 30-59 ml/min   Coronary artery disease due to lipid rich plaque     Subjective   Feels well, denies chest pain and SOB.    Inpatient Medications    Scheduled Meds: . amLODipine  10 mg Oral Daily  . aspirin  81 mg Oral Daily  . clopidogrel  75 mg Oral Daily  . enoxaparin (LOVENOX) injection  40 mg Subcutaneous Q24H  . magnesium oxide  400 mg Oral BID  . metoprolol  100 mg Oral BID  . metoprolol  100 mg Oral BID  . sodium bicarbonate   Intravenous Pre-Cath   And  . sodium bicarbonate (CIN) infusion   Intravenous Pre-Cath   And  . sodium bicarbonate (CIN) infusion   Intravenous Pre-Cath  . sodium chloride flush  3 mL Intravenous Q12H   Continuous Infusions: . sodium chloride    . sodium chloride 75 mL/hr at 04/14/16 2125   PRN Meds: sodium chloride, acetaminophen, ALPRAZolam, gi cocktail, ondansetron (ZOFRAN) IV, sodium chloride flush   Vital Signs    Vitals:   04/14/16 1454 04/14/16 2113 04/14/16 2130 04/15/16 0521  BP: (!) 160/86 (!) 160/90  (!) 103/51  Pulse: 66 63 61 (!) 59  Resp: 18  18 18   Temp: 98.4 F (36.9 C)  98.1 F (36.7 C) 97.4 F (36.3 C)  TempSrc:   Oral Oral  SpO2: 100%  100% 98%  Weight:    273 lb (123.8 kg)  Height:        Intake/Output Summary (Last 24 hours) at 04/15/16 1106 Last data filed at 04/15/16 0525  Gross per 24 hour  Intake              840 ml  Output                0 ml  Net              840 ml   Filed Weights   04/13/16 0331 04/15/16 0521  Weight: 280 lb (127 kg) 273 lb (123.8 kg)    Physical Exam   GEN: Well nourished, well developed, in no acute distress.  HEENT: Grossly normal.  Neck: Supple, no JVD, carotid bruits, or  masses. Cardiac: RRR, no murmurs, rubs, or gallops. No clubbing, cyanosis, edema.  Radials/DP/PT 2+ and equal bilaterally.  Respiratory:  Respirations regular and unlabored, clear to auscultation bilaterally. GI: Soft, nontender, nondistended, BS + x 4. MS: no deformity or atrophy. Skin: warm and dry, no rash. Neuro:  Strength and sensation are intact. Psych: AAOx3.  Normal affect.  Labs    CBC  Recent Labs  04/13/16 0340 04/15/16 0339  WBC 6.3 7.3  HGB 14.0 15.8  HCT 41.1 46.0  MCV 93.4 95.2  PLT 141* [id number]   Basic Metabolic Panel  Recent Labs  04/13/16 0340 04/15/16 0339  NA 140 138  K 4.4 4.7  CL 108 105  CO2 24 27  GLUCOSE 140* 97  BUN 30* 22*  CREATININE 2.04* 1.90*  CALCIUM 9.2 9.6    Cardiac Enzymes  Recent Labs  04/13/16 1106 04/13/16 1442  TROPONINI <0.03 <0.03     Telemetry    NSR - Personally Reviewed  ECG  NSR - Personally Reviewed  Radiology    Nm Myocar Multi W/spect W/wall Motion / Ef  Result Date: 04/14/2016  Horizontal ST segment depression ST segment depression of 3 mm was noted during stress in the V4, V5, V6 and V3 leads, beginning at 1 minutes of stress, ending at 5 minutes of stress, and returning to baseline after less than 1 minute of recovery.  Defect 1: There is a large defect of moderate severity.  Findings consistent with ischemia.  This is an intermediate risk study.  Nuclear stress EF: 47%.  Large size, moderate severity revesrible anterior, anteroapical, anterolateral and anteroseptal wall defect suggestive of ischemia. LVEF 47% with global hypokinesis. This is an intermediate risk study.      Patient Profile  Erik [name] is a 60 y.o. male with a history of CAD s/p CABG (2009), HTN, HLD, obesity, CKD (baseline creat ~2) and RCC s/p R nephrectomy (2007) who presented to [facility] ED with chest pain.      Assessment & Plan    Chest pain: troponin neg x3. ECG with no acute ST/TW changes. High risk myovue with TID  and anteior, anterior Septal and apical ischemia.  For cath today possible left radial  Minimize dye has been hydrated sicne 3:30 yesterday And bicarb ordered for 3:00 today. See graft data below has markers EF by echo 45-50% stable so no LV gram needed  Reviewed notes he brought Had cath after CABG in 2010  LIMA patent to LAD SVG D1 40% SVG RCA occluded SVG OM1/OM3 sequential occluded   HLD: LDL 102 in 5/017. Continue Crestor  Pre diabetes: Hg A1c 6.4 in 08/2015.  CKD s/p nephrectomy: creat at baseline. Hydration and bicarb done held ACE    HTN: continue amlodipine, and lopressor for now. Resume ACE post cath when Cr stable    Jenkins Rouge

## 2016-04-15 NOTE — H&P (View-Only) (Signed)
Patient Name: Erik [name] Date of Encounter: 04/15/2016  Primary Cardiologist: Dr. Shirlee More, [facility] Problem List     Principal Problem:   Chest pain Active Problems:   Essential hypertension with goal blood pressure less than 130/85   CKD (chronic kidney disease) stage 3, GFR 30-59 ml/min   Coronary artery disease due to lipid rich plaque     Subjective   Feels well, denies chest pain and SOB.    Inpatient Medications    Scheduled Meds: . amLODipine  10 mg Oral Daily  . aspirin  81 mg Oral Daily  . clopidogrel  75 mg Oral Daily  . enoxaparin (LOVENOX) injection  40 mg Subcutaneous Q24H  . magnesium oxide  400 mg Oral BID  . metoprolol  100 mg Oral BID  . metoprolol  100 mg Oral BID  . sodium bicarbonate   Intravenous Pre-Cath   And  . sodium bicarbonate (CIN) infusion   Intravenous Pre-Cath   And  . sodium bicarbonate (CIN) infusion   Intravenous Pre-Cath  . sodium chloride flush  3 mL Intravenous Q12H   Continuous Infusions: . sodium chloride    . sodium chloride 75 mL/hr at 04/14/16 2125   PRN Meds: sodium chloride, acetaminophen, ALPRAZolam, gi cocktail, ondansetron (ZOFRAN) IV, sodium chloride flush   Vital Signs    Vitals:   04/14/16 1454 04/14/16 2113 04/14/16 2130 04/15/16 0521  BP: (!) 160/86 (!) 160/90  (!) 103/51  Pulse: 66 63 61 (!) 59  Resp: 18  18 18   Temp: 98.4 F (36.9 C)  98.1 F (36.7 C) 97.4 F (36.3 C)  TempSrc:   Oral Oral  SpO2: 100%  100% 98%  Weight:    273 lb (123.8 kg)  Height:        Intake/Output Summary (Last 24 hours) at 04/15/16 1106 Last data filed at 04/15/16 0525  Gross per 24 hour  Intake              840 ml  Output                0 ml  Net              840 ml   Filed Weights   04/13/16 0331 04/15/16 0521  Weight: 280 lb (127 kg) 273 lb (123.8 kg)    Physical Exam   GEN: Well nourished, well developed, in no acute distress.  HEENT: Grossly normal.  Neck: Supple, no JVD, carotid bruits, or  masses. Cardiac: RRR, no murmurs, rubs, or gallops. No clubbing, cyanosis, edema.  Radials/DP/PT 2+ and equal bilaterally.  Respiratory:  Respirations regular and unlabored, clear to auscultation bilaterally. GI: Soft, nontender, nondistended, BS + x 4. MS: no deformity or atrophy. Skin: warm and dry, no rash. Neuro:  Strength and sensation are intact. Psych: AAOx3.  Normal affect.  Labs    CBC  Recent Labs  04/13/16 0340 04/15/16 0339  WBC 6.3 7.3  HGB 14.0 15.8  HCT 41.1 46.0  MCV 93.4 95.2  PLT 141* [id number]   Basic Metabolic Panel  Recent Labs  04/13/16 0340 04/15/16 0339  NA 140 138  K 4.4 4.7  CL 108 105  CO2 24 27  GLUCOSE 140* 97  BUN 30* 22*  CREATININE 2.04* 1.90*  CALCIUM 9.2 9.6    Cardiac Enzymes  Recent Labs  04/13/16 1106 04/13/16 1442  TROPONINI <0.03 <0.03     Telemetry    NSR - Personally Reviewed  ECG  NSR - Personally Reviewed  Radiology    Nm Myocar Multi W/spect W/wall Motion / Ef  Result Date: 04/14/2016  Horizontal ST segment depression ST segment depression of 3 mm was noted during stress in the V4, V5, V6 and V3 leads, beginning at 1 minutes of stress, ending at 5 minutes of stress, and returning to baseline after less than 1 minute of recovery.  Defect 1: There is a large defect of moderate severity.  Findings consistent with ischemia.  This is an intermediate risk study.  Nuclear stress EF: 47%.  Large size, moderate severity revesrible anterior, anteroapical, anterolateral and anteroseptal wall defect suggestive of ischemia. LVEF 47% with global hypokinesis. This is an intermediate risk study.      Patient Profile  Erik [name] is a 60 y.o. male with a history of CAD s/p CABG (2009), HTN, HLD, obesity, CKD (baseline creat ~2) and RCC s/p R nephrectomy (2007) who presented to [facility] ED with chest pain.      Assessment & Plan    Chest pain: troponin neg x3. ECG with no acute ST/TW changes. High risk myovue with TID  and anteior, anterior Septal and apical ischemia.  For cath today possible left radial  Minimize dye has been hydrated sicne 3:30 yesterday And bicarb ordered for 3:00 today. See graft data below has markers EF by echo 45-50% stable so no LV gram needed  Reviewed notes he brought Had cath after CABG in 2010  LIMA patent to LAD SVG D1 40% SVG RCA occluded SVG OM1/OM3 sequential occluded   HLD: LDL 102 in 5/017. Continue Crestor  Pre diabetes: Hg A1c 6.4 in 08/2015.  CKD s/p nephrectomy: creat at baseline. Hydration and bicarb done held ACE    HTN: continue amlodipine, and lopressor for now. Resume ACE post cath when Cr stable    Jenkins Rouge

## 2016-04-15 NOTE — Progress Notes (Addendum)
Site area: RFA Site Prior to Removal:  Level 0 Pressure Applied For:20 min Manual:   yes Patient Status During Pull: stable  Post Pull Site:  Level 0 Post Pull Instructions Given:  yes Post Pull Pulses Present: palpable Dressing Applied:  tegaderm Bedrest begins @ [id number] till 2120 Comments:

## 2016-04-15 NOTE — Progress Notes (Signed)
PROGRESS NOTE   Millan Legan  EWY:[id number]  DOB: [birth year]  DOA: 04/13/2016 PCP: Alfonso Ellis, NP  Hospital course:  Braden Cimo is a 60 y.o. 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  Assessment & Plan:   1. Chest pain: Symptoms have resolved now.  With abnormal nuclear stress test, patient is having cath done 12/22.  The patient has HEART score of 4. Symptoms are similar to a previous episode before this CABG.  Other potential causes of chest pain (PE, dissection, pancreatitis, pneumonia/effusion, pericarditis) are doubted.  Intense palpitations and alcohol use perhaps were all from symptomatic PVCs?   -Serial troponins are negative, stress myoview reported as high risk, Pt for cath today, was hydrated overnight -Telemetry monitoring -Consult to cardiology, appreciate recommendations  2. HTN and CAD secondary prevention:  Intolerant to statins, including every other day dosing.  Never tried pravastatin. -Continue lisinopril, BB, and amlodipine -Continue aspirin 325 mg and Plavix  3. CKD III-IV:  Baseline Cr 2.0, eGFR 33. Pt was hydrated overnight and creatinine slightly improved this morning.  For cath this morning.   4. Dyslipidemia - uncontrolled -Pt reporting that he is intolerant to statins, defer to cardiology as he has not tried all statins, might tolerate lescol XL or crestor  DVT prophylaxis: Lovenox Code Status: FULL  Family Communication: None present  Disposition Plan: Home  Consults called: Cardiology  Subjective: Pt reports no more chest pain, he is going to cath this morning.  Objective: Vitals:   04/14/16 1454 04/14/16 2113 04/14/16 2130 04/15/16 0521  BP: (!) 160/86 (!) 160/90  (!) 103/51  Pulse: 66 63 61 (!) 59  Resp: _0 Temp: 98.4 F (36.9 C)  98.1 F (36.7 C) 97.4 F (36.3 C)  TempSrc:   Oral Oral  SpO2: 100%   100% 98%  Weight:    123.8 kg (273 lb)  Height:        Intake/Output Summary (Last 24 hours) at 04/15/16 0629 Last data filed at 04/15/16 0525  Gross per 24 hour  Intake              840 ml  Output                0 ml  Net              840 ml   Filed Weights   04/13/16 0331 04/15/16 0521  Weight: 127 kg (280 lb) 123.8 kg (273 lb)    Exam:  General exam: awake, alert, no distress, cooperative.  Respiratory system: Clear. No increased work of breathing. Cardiovascular system: S1 & S2 heard. No JVD, murmurs, gallops, clicks or pedal edema. Gastrointestinal system: Abdomen is nondistended, soft and nontender. Normal bowel sounds heard. Central nervous system: Alert and oriented. No focal neurological deficits. Extremities: no CCE.  Data Reviewed: Basic Metabolic Panel:  Recent Labs Lab 04/13/16 0340 04/15/16 0339  NA 140 138  K 4.4 4.7  CL 108 105  CO2 24 27  GLUCOSE 140* 97  BUN 30* 22*  CREATININE 2.04* 1.90*  CALCIUM 9.2 9.6   Liver Function Tests: No results for input(s): AST, ALT, ALKPHOS, BILITOT, PROT, ALBUMIN in the last 168 hours. No results for input(s): LIPASE, AMYLASE in the last 168 hours. No results for input(s): AMMONIA in the last 168 hours. CBC:  Recent Labs Lab 04/13/16 0340  04/15/16 0339  WBC 6.3 7.3  HGB 14.0 15.8  HCT 41.1 46.0  MCV 93.4 95.2  PLT 141* 156   Cardiac Enzymes:  Recent Labs Lab 04/13/16 1106 04/13/16 1442  TROPONINI <0.03 <0.03   CBG (last 3)  No results for input(s): GLUCAP in the last 72 hours. No results found for this or any previous visit (from the past 240 hour(s)).   Studies: Nm Myocar Multi W/spect W/wall Motion / Ef  Result Date: 04/14/2016  Horizontal ST segment depression ST segment depression of 3 mm was noted during stress in the V4, V5, V6 and V3 leads, beginning at 1 minutes of stress, ending at 5 minutes of stress, and returning to baseline after less than 1 minute of recovery.  Defect 1: There  is a large defect of moderate severity.  Findings consistent with ischemia.  This is an intermediate risk study.  Nuclear stress EF: 47%.  Large size, moderate severity revesrible anterior, anteroapical, anterolateral and anteroseptal wall defect suggestive of ischemia. LVEF 47% with global hypokinesis. This is an intermediate risk study.   Scheduled Meds: . amLODipine  10 mg Oral Daily  . aspirin  81 mg Oral Daily  . clopidogrel  75 mg Oral Daily  . enoxaparin (LOVENOX) injection  40 mg Subcutaneous Q24H  . magnesium oxide  400 mg Oral BID  . metoprolol  100 mg Oral BID  . metoprolol  100 mg Oral BID  . sodium chloride flush  3 mL Intravenous Q12H   Principal Problem:   Chest pain Active Problems:   Essential hypertension with goal blood pressure less than 130/85   CKD (chronic kidney disease) stage 3, GFR 30-59 ml/min   Coronary artery disease due to lipid rich plaque  Time spent:   Irwin Brakeman, MD, FAAFP [facility] Pager [phone]  If 7PM-7AM, please contact night-coverage [email or web address] Password [email or web address] 04/15/2016, 6:29 AM    LOS: 0 days

## 2016-04-15 NOTE — Interval H&P Note (Signed)
Cath Lab Visit (complete for each Cath Lab visit)  Clinical Evaluation Leading to the Procedure:   ACS: Yes.    Non-ACS:    Anginal Classification: CCS III  Anti-ischemic medical therapy: Minimal Therapy (1 class of medications)  Non-Invasive Test Results: Intermediate-risk stress test findings: cardiac mortality 1-3%/year  Prior CABG: Previous CABG      History and Physical Interval Note:  04/15/2016 3:57 PM  Erik [name]  has presented today for surgery, with the diagnosis of cp  The various methods of treatment have been discussed with the patient and family. After consideration of risks, benefits and other options for treatment, the patient has consented to  Procedure(s): Left Heart Cath and Cors/Grafts Angiography (N/A) as a surgical intervention .  The patient's history has been reviewed, patient examined, no change in status, stable for surgery.  I have reviewed the patient's chart and labs.  Questions were answered to the patient's satisfaction.     Quay Burow

## 2016-04-16 DIAGNOSIS — Z951 Presence of aortocoronary bypass graft: Secondary | ICD-10-CM

## 2016-04-16 DIAGNOSIS — I10 Essential (primary) hypertension: Secondary | ICD-10-CM

## 2016-04-16 DIAGNOSIS — I200 Unstable angina: Secondary | ICD-10-CM

## 2016-04-16 DIAGNOSIS — I2583 Coronary atherosclerosis due to lipid rich plaque: Secondary | ICD-10-CM

## 2016-04-16 DIAGNOSIS — N183 Chronic kidney disease, stage 3 (moderate): Secondary | ICD-10-CM

## 2016-04-16 LAB — BASIC METABOLIC PANEL
ANION GAP: 7 (ref 5–15)
BUN: 24 mg/dL — AB (ref 6–20)
CO2: 29 mmol/L (ref 22–32)
Calcium: 9.3 mg/dL (ref 8.9–10.3)
Chloride: 103 mmol/L (ref 101–111)
Creatinine, Ser: 1.8 mg/dL — ABNORMAL HIGH (ref 0.61–1.24)
GFR, EST AFRICAN AMERICAN: 45 mL/min — AB (ref >60)
GFR, EST NON AFRICAN AMERICAN: 39 mL/min — AB (ref >60)
Glucose, Bld: 89 mg/dL (ref 65–99)
POTASSIUM: 4.6 mmol/L (ref 3.5–5.1)
SODIUM: 139 mmol/L (ref 135–145)

## 2016-04-16 LAB — CBC
HCT: 44.1 % (ref 39.0–52.0)
HEMOGLOBIN: 15.1 g/dL (ref 13.0–17.0)
MCH: 31.9 pg (ref 26.0–34.0)
MCHC: 34.2 g/dL (ref 30.0–36.0)
MCV: 93 fL (ref 78.0–100.0)
PLATELETS: 152 10*3/uL (ref 150–400)
RBC: 4.74 MIL/uL (ref 4.22–5.81)
RDW: 13.1 % (ref 11.5–15.5)
WBC: 6.6 10*3/uL (ref 4.0–10.5)

## 2016-04-16 MED ORDER — AMLODIPINE BESYLATE 5 MG PO TABS: 5.0000 mg | ORAL_TABLET | Freq: Every day | ORAL | 3 refills | Status: DC

## 2016-04-16 MED ORDER — LISINOPRIL 5 MG PO TABS: 5.0000 mg | ORAL_TABLET | Freq: Every day | ORAL | 3 refills | Status: AC

## 2016-04-16 MED ORDER — AMLODIPINE BESYLATE 5 MG PO TABS
5.0000 mg | ORAL_TABLET | Freq: Every day | ORAL | Status: DC
  Administered 2016-04-16: 5 mg via ORAL
  Filled 2016-04-16: qty 1

## 2016-04-16 MED ORDER — AMLODIPINE BESYLATE 5 MG PO TABS: 10.0000 mg | ORAL_TABLET | Freq: Every day | ORAL | 3 refills | Status: DC

## 2016-04-16 NOTE — Progress Notes (Signed)
Discharge instructions given to pat.  Questions answered.  IV site discontinued  And telemetry discontinued.  CCMD notified regarding tele.  Mervyn Skeeters, RN

## 2016-04-16 NOTE — Discharge Summary (Addendum)
Physician Discharge Summary   Patient ID: Erik [name] MRN: [id number] DOB/AGE: 1955/09/30 60 y.o.  Admit date: 04/13/2016 Discharge date: 04/16/2016  Primary Care Physician:  HILL,STACY, NP  Discharge Diagnoses:    . Chest pain . Essential hypertension with goal blood pressure less than 130/85 . CKD (chronic kidney disease) stage 3, GFR 30-59 ml/min . Coronary artery disease due to lipid rich plaque   Consults: cardiology  Recommendations for Outpatient Follow-up:  1. Please repeat CBC/BMET at next visit   DIET: heart healthy diet     Allergies:  No Known Allergies   DISCHARGE MEDICATIONS: Current Discharge Medication List    CONTINUE these medications which have CHANGED   Details  amLODipine (NORVASC) 5 MG tablet Take 1 tablet (5 mg total) by mouth daily. Qty: 30 tablet, Refills: 3    lisinopril (PRINIVIL,ZESTRIL) 5 MG tablet Take 1 tablet (5 mg total) by mouth daily. Qty: 30 tablet, Refills: 3      CONTINUE these medications which have NOT CHANGED   Details  acetaminophen (TYLENOL) 500 MG tablet Take 500-1,000 mg by mouth every 6 (six) hours as needed (for arthritic pain).    aspirin 325 MG tablet Take 325 mg by mouth daily.    Cholecalciferol (VITAMIN D3) 2000 units capsule Take 2,000 Units by mouth daily.    clopidogrel (PLAVIX) 75 MG tablet Take 75 mg by mouth daily.    magnesium oxide (MAG-OX) 400 MG tablet Take 400 mg by mouth 2 (two) times daily.    metoprolol (LOPRESSOR) 100 MG tablet Take 100 mg by mouth daily.         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    Hospital Course:   Chest pain: Symptoms have resolved now.  With abnormal nuclear stress test, patient is having cath done 12/22. The patient has  HEART score of 4. Symptoms are similar to a previous episode before this CABG.  - Serial troponins were negative - stress myoview reported as high risk, Large size, moderate severity revesrible anterior, anteroapical, anterolateral and anteroseptal wall defect suggestive of ischemia. LVEF 47% with global hypokinesis. This is an intermediate risk study. - Cath 12/23 showed:  Mid Cx lesion, 60 %stenosed.  Mid LAD to Dist LAD lesion, 100 %stenosed.  Ost 2nd Diag to 2nd Diag lesion, 90 %stenosed.  Origin lesion, 100 %stenosed.  Origin lesion, 100 %stenosed.  Dist RCA lesion, 99 %stenosed. LIMA to his LAD is patent and a vein to the diagonal branch is patent as well   Per cardiology, recommended medical therapy. Patient wants to follow [place] care, will arrange follow-up. Currently somewhat hypotensive, Dr. Meda Coffee recommended lisinopril 5 mg daily and decrease amlodipine to 5 mg daily. Can consider adding low-dose Imdur or ranexa outpatient if symptoms persist.   HTN and CAD secondary prevention: Intolerant to statins, including every other day dosing. Never tried pravastatin. -Continue lisinopril, BB, and amlodipine -Continue aspirin 325 mg and Plavix   CKD III-IV: Baseline Cr 2.0, eGFR 33. Pt was hydrated overnight and creatinine slightly improved this morning. Creatinine improving, 1.8 at the time of discharge.  Dyslipidemia - uncontrolled -Pt reporting that he is intolerant to statins, defer to cardiology as he has not tried all statins, might tolerate lescol XL or crestor  Day of Discharge BP 126/65   Pulse 65   Temp 97.8 F (36.6 C) (Oral)   Resp 18   Ht 6' 1"  (1.854 m)   Wt 123.7 kg (272 lb 12.8 oz)   SpO2 97%   BMI 35.99 kg/m   Physical Exam: General: Alert and awake oriented x3 not in any acute distress. HEENT: anicteric sclera, pupils reactive to light and accommodation CVS: S1-S2 clear no murmur rubs or gallops Chest: clear to auscultation  bilaterally, no wheezing rales or rhonchi Abdomen: soft nontender, nondistended, normal bowel sounds Extremities: no cyanosis, clubbing or edema noted bilaterally Neuro: Cranial nerves II-XII intact, no focal neurological deficits   The results of significant diagnostics from this hospitalization (including imaging, microbiology, ancillary and laboratory) are listed below for reference.    LAB RESULTS: Basic Metabolic Panel:  Recent Labs Lab 04/15/16 0339 04/16/16 0404  NA 138 139  K 4.7 4.6  CL 105 103  CO2 27 29  GLUCOSE 97 89  BUN 22* 24*  CREATININE 1.90* 1.80*  CALCIUM 9.6 9.3   Liver Function Tests: No results for input(s): AST, ALT, ALKPHOS, BILITOT, PROT, ALBUMIN in the last 168 hours. No results for input(s): LIPASE, AMYLASE in the last 168 hours. No results for input(s): AMMONIA in the last 168 hours. CBC:  Recent Labs Lab 04/15/16 0339 04/16/16 0404  WBC 7.3 6.6  HGB 15.8 15.1  HCT 46.0 44.1  MCV 95.2 93.0  PLT 156 152   Cardiac Enzymes:  Recent Labs Lab 04/13/16 1106 04/13/16 1442  TROPONINI <0.03 <0.03   BNP: Invalid input(s): POCBNP CBG: No results for input(s): GLUCAP in the last 168 hours.  Significant Diagnostic Studies:  Dg Chest 2 View  Result Date: 04/13/2016 CLINICAL DATA:  Chest pressure tonight. EXAM: CHEST  2 VIEW COMPARISON:  None. FINDINGS: Mild cardiomegaly. Prior sternotomy and CABG. The lungs are clear. There is no pleural effusion. The pulmonary vasculature is normal. Hilar and mediastinal contours are unremarkable. IMPRESSION: Cardiomegaly.  No consolidation or effusion. Electronically Signed   By: Andreas Newport M.D.   On: 04/13/2016 04:07   Nm Myocar Multi W/spect W/wall Motion / Ef  Result Date: 04/14/2016  Horizontal ST segment depression ST segment depression of 3 mm was noted during stress in the V4, V5, V6 and V3 leads, beginning at 1 minutes of stress, ending at 5 minutes of stress, and returning to baseline  after less than 1 minute of recovery.  Defect 1: There is a large defect of moderate severity.  Findings consistent with ischemia.  This is an intermediate risk study.  Nuclear stress EF: 47%.  Large size, moderate severity revesrible anterior, anteroapical, anterolateral and anteroseptal wall defect suggestive of ischemia. LVEF 47% with global hypokinesis. This is an intermediate risk study.    2D ECHO:   Disposition and Follow-up: Discharge Instructions    Diet - low sodium heart healthy    Complete by:  As directed    Increase activity slowly    Complete by:  As directed        DISPOSITION: Home  DISCHARGE FOLLOW-UP Follow-up Information    Jenkins Rouge, MD. Schedule an appointment as soon as possible for a visit in 2 week(s).   Specialty:  Cardiology Contact information: [address] [address] [phone]        HILL,STACY, NP. Schedule an appointment as soon as possible for a visit in 2 week(s).   Specialty:  Nurse Practitioner Contact information: [phone]  [place] [address] [phone]            Time spent on Discharge: 35 minutes  Signed:   Freman Lapage M.D. [facility] 04/16/2016, 11:22 AM Pager: [phone]  Addendum Coding query Chest pain secondary to angina from CAD    Burnice Vassel M.D. [facility] 04/26/2016, 10:11 PM  Pager: [phone]

## 2016-04-16 NOTE — Progress Notes (Addendum)
Patient Name: Erik [name] Date of Encounter: 04/16/2016  Primary Cardiologist: Dr. Shirlee More, [facility] Problem List     Principal Problem:   Chest pain Active Problems:   Essential hypertension with goal blood pressure less than 130/85   CKD (chronic kidney disease) stage 3, GFR 30-59 ml/min   Coronary artery disease due to lipid rich plaque   Hx of CABG   Subjective   Feels well, denies chest pain and SOB.  Right groin insertion site has no bleeding.  Inpatient Medications    Scheduled Meds: . amLODipine  10 mg Oral Daily  . aspirin  81 mg Oral Daily  . clopidogrel  75 mg Oral Daily  . enoxaparin (LOVENOX) injection  40 mg Subcutaneous Q24H  . magnesium oxide  400 mg Oral BID  . metoprolol  100 mg Oral BID  . metoprolol  100 mg Oral BID  . sodium chloride flush  3 mL Intravenous Q12H   Continuous Infusions: . sodium chloride     PRN Meds: sodium chloride, acetaminophen, ALPRAZolam, gi cocktail, morphine injection, ondansetron (ZOFRAN) IV, sodium chloride flush   Vital Signs    Vitals:   04/15/16 1800 04/15/16 1815 04/15/16 2022 04/16/16 0500  BP: 121/70 120/72 114/71 99/71  Pulse: 65 (!) 59 70 64  Resp: 18 18 18 18   Temp:   97.9 F (36.6 C) 97.8 F (36.6 C)  TempSrc:   Oral Oral  SpO2: 98% 98% 95% 97%  Weight:    272 lb 12.8 oz (123.7 kg)  Height:        Intake/Output Summary (Last 24 hours) at 04/16/16 0859 Last data filed at 04/16/16 0853  Gross per 24 hour  Intake              720 ml  Output              550 ml  Net              170 ml   Filed Weights   04/13/16 0331 04/15/16 0521 04/16/16 0500  Weight: 280 lb (127 kg) 273 lb (123.8 kg) 272 lb 12.8 oz (123.7 kg)   Physical Exam   GEN: Well nourished, well developed, in no acute distress.  HEENT: Grossly normal.  Neck: Supple, no JVD, carotid bruits, or masses. Cardiac: RRR, no murmurs, rubs, or gallops. No clubbing, cyanosis, edema.  Radials/DP/PT 2+ and equal bilaterally.    Respiratory:  Respirations regular and unlabored, clear to auscultation bilaterally. GI: Soft, nontender, nondistended, BS + x 4. MS: no deformity or atrophy. Skin: warm and dry, no rash. Neuro:  Strength and sensation are intact. Psych: AAOx3.  Normal affect.  Labs    CBC  Recent Labs  04/15/16 0339 04/16/16 0404  WBC 7.3 6.6  HGB 15.8 15.1  HCT 46.0 44.1  MCV 95.2 93.0  PLT 156 0000000   Basic Metabolic Panel  Recent Labs  04/15/16 0339 04/16/16 0404  NA 138 139  K 4.7 4.6  CL 105 103  CO2 27 29  GLUCOSE 97 89  BUN 22* 24*  CREATININE 1.90* 1.80*  CALCIUM 9.6 9.3   Cardiac Enzymes  Recent Labs  04/13/16 1106 04/13/16 1442  TROPONINI <0.03 <0.03     Telemetry    NSR - Personally Reviewed  ECG    NSR - Personally Reviewed  Radiology    Nm Myocar Multi W/spect W/wall Motion / Ef  Result Date: 04/14/2016  Horizontal ST segment depression ST segment  depression of 3 mm was noted during stress in the V4, V5, V6 and V3 leads, beginning at 1 minutes of stress, ending at 5 minutes of stress, and returning to baseline after less than 1 minute of recovery.  Defect 1: There is a large defect of moderate severity.  Findings consistent with ischemia.  This is an intermediate risk study.  Nuclear stress EF: 47%.  Large size, moderate severity revesrible anterior, anteroapical, anterolateral and anteroseptal wall defect suggestive of ischemia. LVEF 47% with global hypokinesis. This is an intermediate risk study.      Patient Profile  Erik [name] is a 60 y.o. male with a history of CAD s/p CABG (2009), HTN, HLD, obesity, CKD (baseline creat ~2) and RCC s/p R nephrectomy (2007) who presented to [facility] ED with chest pain.      Assessment & Plan    Chest pain: troponin neg x3. ECG with no acute ST/TW changes. High risk myovue with TID and anteior, anterior Septal and apical ischemia.  For cath today possible left radial  Minimize dye has been hydrated sicne 3:30  yesterday And bicarb ordered for 3:00 today. See graft data below has markers EF by echo 45-50% stable so no LV gram needed  Cath yesterday showed:  Mid Cx lesion, 60 %stenosed.  Mid LAD to Dist LAD lesion, 100 %stenosed.  Ost 2nd Diag to 2nd Diag lesion, 90 %stenosed.  Origin lesion, 100 %stenosed.  Origin lesion, 100 %stenosed.  Dist RCA lesion, 99 %stenosed.   LIMA to his LAD is patent and a vein to the diagonal branch is patent as well  HLD: LDL 102 in 5/017. Continue Crestor  Pre diabetes: Hg A1c 6.4 in 08/2015.  CKD s/p nephrectomy: creat at baseline. Hydration and bicarb done held ACE    HTN: continue amlodipine, and lopressor for now. Resume ACE post cath when Cr stable   The patient is asymptomatic, medical therapy was decided. He used to follow in [facility] wants to switch to [place]. We will arrange for an outpatient follow up. I would add ACEI - lisinopril 5 mg po daily, decrease amlodipine to 5 mg po daily for now as he is hypotensive. We can consider adding low dose Imdur or ranolazine at that time if symptoms persists.   Ena Dawley

## 2016-04-19 ENCOUNTER — Encounter (HOSPITAL_COMMUNITY): Payer: Self-pay | Admitting: Cardiovascular Disease

## 2016-05-04 NOTE — Progress Notes (Signed)
Cardiology Office Note   Date:  05/05/2016   ID:  Erik [name], DOB 11-14-1955, MRN [id number]  PCP:  Alfonso Ellis, NP  Cardiologist:  Dr. Dorthula Nettles    Now Dr. [name]   Chief Complaint  Patient presents with  . Hospitalization Follow-up    post cath and chest pain      History of Present Illness: Erik [name] is a 61 y.o. male who presents for post hospitalization.   He has a history of CAD s/p CABG (2009), HTN, HLD, obesity, CKD (baseline creat ~2), and RCC s/p R nephrectomy (2007) who presented to [facility] ED with chest pain.   He has a history  s/p LIMA graft to LAD; Vein grafts to Diag, LCX/OM and RCA in 2009 in the setting of what sounds like [place]. Follow-up coronary angiogram in 2010 revealed patent grafts to the left anterior descending and diagonal vessels. Native right coronary artery and circumflex vessels were patent on that study.  Pt admitted with chest pain had + nuc, cath with occluded RCA and circumflex vein graft consistent with his previously known anatomy. His LIMA to his LAD is patent and a vein to the diagonal branch is patent as well. He does have a small first diagonal branch arising from the mid vessel just prior to occlusion of the LAD which is a 1.7 mm vessel and has a 90% ostial/proximal stenosis. This may indeed be the cause of his anterolateral ischemic abnormality on Myoview. His RCA has high-grade distal disease with collaterals from the left(ischemia is in the LAD diagonal branch distribution. Per Dr. Gwenlyn Found " I have reviewed his angiogram with Dr. [name]  and we both agree that medical therapy at this point would be most appropriate."  We will arrange for an outpatient follow up.at discharge ACEI - lisinopril 5 mg po daily, decrease amlodipine to 5 mg po daily for now as he is hypotensive. We can consider adding low dose Imdur or ranolazine at that time if symptoms persists.   Today he has seen by PCP and amlodipine was increased to 10 mg. Labs were  done Cr. 1.91, BUN 29  K+ 5.2  hgb 14.9 and HCT 44.8   He has had no chest pain and no SOB.  Will give pscript for sl NTG and we discussed if he developed chest pain we would add Imdur and ranexa.  He is now on fluvastatin now will try to take. - other caused muscle pain,  We discussed repatha and that may be option through lipid clinic.       Past Medical History:  Diagnosis Date  . Cancer [facility])    Renal cancer s/p Right nephrectomy  . Coronary artery disease   . Hypertension   . Renal disorder     Past Surgical History:  Procedure Laterality Date  . CARDIAC CATHETERIZATION N/A 04/15/2016   Procedure: Left Heart Cath and Cors/Grafts Angiography;  Surgeon: Lorretta Harp, MD;  Location: [facility];  Service: Cardiovascular;  Laterality: N/A;  . CARDIAC SURGERY    . NEPHRECTOMY Right      Current Outpatient Prescriptions  Medication Sig Dispense Refill  . acetaminophen (TYLENOL) 500 MG tablet Take 500-1,000 mg by mouth every 6 (six) hours as needed (for arthritic pain).    Marland Kitchen amLODipine (NORVASC) 5 MG tablet Take 1 tablet (5 mg total) by mouth daily. 30 tablet 3  . aspirin 325 MG tablet Take 325 mg by mouth daily.    . Cholecalciferol (VITAMIN D3)  2000 units capsule Take 2,000 Units by mouth daily.    . clopidogrel (PLAVIX) 75 MG tablet Take 75 mg by mouth daily.    . fluvastatin (LESCOL) 40 MG capsule Take 40 mg by mouth daily.    Marland Kitchen lisinopril (PRINIVIL,ZESTRIL) 5 MG tablet Take 1 tablet (5 mg total) by mouth daily. 30 tablet 3  . magnesium oxide (MAG-OX) 400 MG tablet Take 400 mg by mouth 2 (two) times daily.    . metoprolol (LOPRESSOR) 100 MG tablet Take 100 mg by mouth daily.     No current facility-administered medications for this visit.     Allergies:   Rosuvastatin    Social History:  The patient  reports that he has never smoked. He has never used smokeless tobacco. He reports that he drinks about 1.2 oz of alcohol per week . He reports that he does not use  drugs.   Family History:  The patient's family history includes Heart attack in his father; Heart attack (age of onset: 38) in his brother.    ROS:  General:no colds or fevers, no weight changes Skin:no rashes or ulcers HEENT:no blurred vision, no congestion CV:see HPI PUL:see HPI GI:no diarrhea constipation or melena, no indigestion GU:no hematuria, no dysuria MS:no joint pain, no claudication Neuro:no syncope, no lightheadedness Endo:no diabetes, no thyroid disease  Wt Readings from Last 3 Encounters:  05/05/16 279 lb (126.6 kg)  04/16/16 272 lb 12.8 oz (123.7 kg)     PHYSICAL EXAM: VS:  BP (!) 150/84   Pulse 68   Ht 6\' 1"  (1.854 m)   Wt 279 lb (126.6 kg)   BMI 36.81 kg/m  , BMI Body mass index is 36.81 kg/m. General:Pleasant affect, NAD Skin:Warm and dry, brisk capillary refill HEENT:normocephalic, sclera clear, mucus membranes moist Neck:supple, no JVD, no bruits  Heart:S1S2 RRR without murmur, gallup, rub or click Lungs:clear without rales, rhonchi, or wheezes [id number], non tender, + BS, do not palpate liver spleen or masses Ext:no lower ext edema, 2+ pedal pulses, 2+ radial pulses Neuro:alert and oriented, MAE, follows commands, + facial symmetry    EKG:  EKG is ordered today. The ekg ordered today demonstrates SR with 1st degree AV block no acute changes.   Recent Labs: 04/16/2016: BUN 24; Creatinine, Ser 1.80; Hemoglobin 15.1; Platelets 152; Potassium 4.6; Sodium 139    Lipid Panel    Component Value Date/Time   CHOL 214 (H) 04/15/2016 0339   TRIG 283 (H) 04/15/2016 0339   HDL 40 (L) 04/15/2016 0339   CHOLHDL 5.4 04/15/2016 0339   VLDL 57 (H) 04/15/2016 0339   LDLCALC 117 (H) 04/15/2016 [id number]       Other studies Reviewed: Additional studies/ records that were reviewed today include: .  ECHO: Study Conclusions  - Left ventricle: The cavity size was normal. There was moderate   concentric hypertrophy. Systolic function was mildly reduced.  The   estimated ejection fraction was in the range of 45% to 50%. Mild   hypokinesis of the anteroseptal, anterior, and inferoseptal   myocardium. Features are consistent with a pseudonormal left   ventricular filling pattern, with concomitant abnormal relaxation   and increased filling pressure (grade 2 diastolic dysfunction). - Aortic valve: Transvalvular velocity was within the normal range.   There was no stenosis. There was no regurgitation. - Mitral valve: Transvalvular velocity was within the normal range.   There was no evidence for stenosis. There was mild regurgitation. - Right ventricle: The cavity size was normal. Wall thickness  was   normal. Systolic function was normal. - Pulmonary arteries: Systolic pressure was within the normal   range. PA peak pressure: 23 mm Hg (S).  Procedures   Left Heart Cath and Cors/Grafts Angiography  Conclusion     Mid Cx lesion, 60 %stenosed.  Mid LAD to Dist LAD lesion, 100 %stenosed.  Ost 2nd Diag to 2nd Diag lesion, 90 %stenosed.  Origin lesion, 100 %stenosed.  Origin lesion, 100 %stenosed.  Dist RCA lesion, 99 %stenosed     ASSESSMENT AND PLAN:  1.  Angina with CAD cath with 90% stenosis 2nd diag.  No recurrent pain SL NTG prescribe. If pain occurs he will call and imdur will be added.  Follow up with Dr. [name] in 6 weeks.  2. HTN amlodipine resumed ant 10 mg daily. hesitant to increase ace with elevated K+ and Cr with single kidney.  Will recheck BP in 2 weeks if elevated will add imdur.  3.  CAD treating medically hx of CABG.  4. CKD 3 with s/p nephrectomy  PCP is referring to nephrology  5. HLD on fluvastatin if cannot tolerate will send to Lipid clinic for repatha.  Current medicines are reviewed with the patient today.  The patient Has no concerns regarding medicines.  The following changes have been made:  See above Labs/ tests ordered today include:see above  Disposition:   FU:  see above  Signed, Cecilie Kicks, NP  05/05/2016 9:52 AM    [address], [place], [place], [place] Phone: [phone]; Fax: [phone]

## 2016-05-05 ENCOUNTER — Encounter: Payer: Self-pay | Admitting: Cardiology

## 2016-05-05 ENCOUNTER — Ambulatory Visit (INDEPENDENT_AMBULATORY_CARE_PROVIDER_SITE_OTHER): Payer: 59 | Admitting: Cardiology

## 2016-05-05 VITALS — BP 150/84 | HR 68 | Ht 73.0 in | Wt 279.0 lb

## 2016-05-05 DIAGNOSIS — I10 Essential (primary) hypertension: Secondary | ICD-10-CM

## 2016-05-05 DIAGNOSIS — Z905 Acquired absence of kidney: Secondary | ICD-10-CM | POA: Diagnosis not present

## 2016-05-05 DIAGNOSIS — I25708 Atherosclerosis of coronary artery bypass graft(s), unspecified, with other forms of angina pectoris: Secondary | ICD-10-CM | POA: Diagnosis not present

## 2016-05-05 DIAGNOSIS — N183 Chronic kidney disease, stage 3 unspecified: Secondary | ICD-10-CM

## 2016-05-05 DIAGNOSIS — E782 Mixed hyperlipidemia: Secondary | ICD-10-CM

## 2016-05-05 MED ORDER — NITROGLYCERIN 0.4 MG SL SUBL
0.4000 mg | SUBLINGUAL_TABLET | SUBLINGUAL | 3 refills | Status: DC | PRN
Start: 2016-05-05 — End: 2017-09-26

## 2016-05-05 MED ORDER — AMLODIPINE BESYLATE 10 MG PO TABS: 10.0000 mg | ORAL_TABLET | Freq: Every day | ORAL | Status: DC

## 2016-05-05 MED ORDER — AMLODIPINE BESYLATE 10 MG PO TABS: 10.0000 mg | ORAL_TABLET | Freq: Every day | ORAL | 11 refills | Status: DC

## 2016-05-05 NOTE — Patient Instructions (Signed)
Medication Instructions:  Your physician has recommended you make the following change in your medication:  INCREASE Amlodipine to 10mg  daily   Labwork: None ordered  Testing/Procedures: None ordered  Follow-Up: Your physician recommends that you schedule a follow-up appointment in: 2 weeks with a nurse for a BP check  Your physician recommends that you schedule a follow-up appointment in: March 2018 with Dr.Nishan    Any Other Special Instructions Will Be Listed Below (If Applicable).     If you need a refill on your cardiac medications before your next appointment, please call your pharmacy.

## 2016-05-20 ENCOUNTER — Ambulatory Visit (INDEPENDENT_AMBULATORY_CARE_PROVIDER_SITE_OTHER): Payer: 59

## 2016-05-20 VITALS — BP 128/72 | HR 67 | Ht 73.0 in | Wt 284.0 lb

## 2016-05-20 DIAGNOSIS — I10 Essential (primary) hypertension: Secondary | ICD-10-CM

## 2016-05-20 NOTE — Patient Instructions (Signed)
Medication Instructions:  Your physician recommends that you continue on your current medications as directed. Please refer to the Current Medication list given to you today.  Labwork: NONE  Testing/Procedures: NONE  Follow-Up: Your physician wants you to follow-up with Dr. [name] as already scheduled.

## 2016-05-20 NOTE — Progress Notes (Signed)
1.) Reason for visit: BP check  2.) Name of MD requesting visit: Cecilie Kicks NP  3.) H&P: Patient has history of CAD s/p CABG (2009), HTN, HLD, obesity, CKD, and [facility] s/p R nephrectomy (2007). At patient's last office visit on 05/05/16 with Cecilie Kicks NP, BP was 150/84 and HR 68. Patient was instructed to increase his Amlodipine to 10mg  by mouth daily.   4.) ROS related to problem: Patient's vital signs are BP 128/72, HR 67, O2 98% on room air. Patient denies any chest pain, SOB, or dizziness. Patient stated he has been taking increased dose of Amlodipine without any problems. Patient wanted to compare his home BP machine with manual, reading was 135/78.  5.) Assessment and plan per MD: Consulted Dr. Harrington Challenger, DOD, she stated BP is good. No changes at this time. Patient has follow-up appointment with Dr. [name] in March.

## 2016-05-27 ENCOUNTER — Encounter: Payer: Self-pay | Admitting: Internal Medicine

## 2016-06-02 NOTE — Progress Notes (Signed)
Cardiology Office Note   Date:  06/15/2016   ID:  Erik [name], DOB [birth year], MRN [id number]  PCP:  Alfonso Ellis, NP  Cardiologist:  Dr. Dorthula Nettles    Now Dr. [name]   Chief Complaint  Patient presents with  . Coronary Artery Disease      History of Present Illness: Erik [name] is a 61 y.o. male who presents for post hospitalization.   He has a history of CAD s/p CABG (2009), HTN, HLD, obesity, CKD (baseline creat ~2), and RCC s/p R nephrectomy (2007) who presented to [facility] ED with chest pain.   He has a history  s/p LIMA graft to LAD; Vein grafts to Diag, LCX/OM and RCA in 2009 in the setting of what sounds like [place]. Follow-up coronary angiogram in 2010 revealed patent grafts to the left anterior descending and diagonal vessels. Native right coronary artery and circumflex vessels were patent on that study.  Pt admitted with chest pain had + nuc, cath with occluded RCA and circumflex vein graft consistent with his previously known anatomy. His LIMA to his LAD is patent and a vein to the diagonal branch is patent as well. He does have a small first diagonal branch arising from the mid vessel just prior to occlusion of the LAD which is a 1.7 mm vessel and has a 90% ostial/proximal stenosis. This may indeed be the cause of his anterolateral ischemic abnormality on Myoview. His RCA has high-grade distal disease with collaterals from the left(ischemia is in the LAD diagonal branch distribution. Per Dr. Gwenlyn Found " I have reviewed his angiogram with Dr. [name]  and we both agree that medical therapy at this point would be most appropriate."  BP better on current dose of norvasc  He has had no chest pain and no SOB.  Will give pscript for sl NTG and we discussed if he developed chest pain we would add Imdur and ranexa.  He is now on fluvastatin now will try to take. - other caused muscle pain,  We discussed repatha and that may be option through lipid clinic.       Hospitalized at  [facility]  Earlier in month for ? TIA. Had dizziness and feeling of inability to talk coherantly Reviewed over 50 pages of records. MRI with lacunar infarct. TEE no SOE normal EF He does get Palpitations and skips. Had Zio monitor just taken off has not had report back yet.   Past Medical History:  Diagnosis Date  . Cancer [facility])    Renal cancer s/p Right nephrectomy  . Coronary artery disease   . Hypertension   . Renal disorder     Past Surgical History:  Procedure Laterality Date  . CARDIAC CATHETERIZATION N/A 04/15/2016   Procedure: Left Heart Cath and Cors/Grafts Angiography;  Surgeon: Lorretta Harp, MD;  Location: [place] [facility];  Service: Cardiovascular;  Laterality: N/A;  . CARDIAC SURGERY    . NEPHRECTOMY Right      Current Outpatient Prescriptions  Medication Sig Dispense Refill  . acetaminophen (TYLENOL) 500 MG tablet Take 500-1,000 mg by mouth every 6 (six) hours as needed (for arthritic pain).    Marland Kitchen amLODipine (NORVASC) 10 MG tablet Take 1 tablet (10 mg total) by mouth daily. 30 tablet 11  . aspirin 325 MG tablet Take 325 mg by mouth daily.    . Cholecalciferol (VITAMIN D3) 2000 units capsule Take 2,000 Units by mouth daily.    . clopidogrel (PLAVIX) 75 MG tablet Take 75 mg by  mouth daily.    . fluvastatin (LESCOL) 40 MG capsule Take 40 mg by mouth daily.    Marland Kitchen lisinopril (PRINIVIL,ZESTRIL) 5 MG tablet Take 1 tablet (5 mg total) by mouth daily. 30 tablet 3  . magnesium oxide (MAG-OX) 400 MG tablet Take 400 mg by mouth 2 (two) times daily.    . metoprolol succinate (TOPROL-XL) 100 MG 24 hr tablet Take 100 mg by mouth daily.    . nitroGLYCERIN (NITROSTAT) 0.4 MG SL tablet Place 1 tablet (0.4 mg total) under the tongue every 5 (five) minutes as needed. 25 tablet 3   No current facility-administered medications for this visit.     Allergies:   Rosuvastatin    Social History:  The patient  reports that he has never smoked. He has never used smokeless tobacco. He  reports that he drinks about 1.2 oz of alcohol per week . He reports that he does not use drugs.   Family History:  The patient's family history includes Heart attack in his father; Heart attack (age of onset: 68) in his brother.    ROS:  General:no colds or fevers, no weight changes Skin:no rashes or ulcers HEENT:no blurred vision, no congestion CV:see HPI PUL:see HPI GI:no diarrhea constipation or melena, no indigestion GU:no hematuria, no dysuria MS:no joint pain, no claudication Neuro:no syncope, no lightheadedness Endo:no diabetes, no thyroid disease  Wt Readings from Last 3 Encounters:  06/15/16 280 lb 6.4 oz (127.2 kg)  05/20/16 284 lb (128.8 kg)  05/05/16 279 lb (126.6 kg)     PHYSICAL EXAM: VS:  BP (!) 150/64   Pulse 63   Ht 5\' 11"  (1.803 m)   Wt 280 lb 6.4 oz (127.2 kg)   SpO2 98%   BMI 39.11 kg/m  , BMI Body mass index is 39.11 kg/m. General:Pleasant affect, NAD Skin:Warm and dry, brisk capillary refill HEENT:normocephalic, sclera clear, mucus membranes moist Neck:supple, no JVD, no bruits  Heart:S1S2 RRR without murmur, gallup, rub or click Lungs:clear without rales, rhonchi, or wheezes [id number], non tender, + BS, do not palpate liver spleen or masses Ext:no lower ext edema, 2+ pedal pulses, 2+ radial pulses Neuro:alert and oriented, MAE, follows commands, + facial symmetry    EKG:  EKG is ordered today. The ekg ordered today demonstrates SR with 1st degree AV block no acute changes.   Recent Labs: 04/16/2016: BUN 24; Creatinine, Ser 1.80; Hemoglobin 15.1; Platelets 152; Potassium 4.6; Sodium 139    Lipid Panel    Component Value Date/Time   CHOL 214 (H) 04/15/2016 0339   TRIG 283 (H) 04/15/2016 0339   HDL 40 (L) 04/15/2016 0339   CHOLHDL 5.4 04/15/2016 0339   VLDL 57 (H) 04/15/2016 0339   LDLCALC 117 (H) 04/15/2016 [id number]       Other studies Reviewed: Additional studies/ records that were reviewed today include: .  ECHO: Study  Conclusions  - Left ventricle: The cavity size was normal. There was moderate   concentric hypertrophy. Systolic function was mildly reduced. The   estimated ejection fraction was in the range of 45% to 50%. Mild   hypokinesis of the anteroseptal, anterior, and inferoseptal   myocardium. Features are consistent with a pseudonormal left   ventricular filling pattern, with concomitant abnormal relaxation   and increased filling pressure (grade 2 diastolic dysfunction). - Aortic valve: Transvalvular velocity was within the normal range.   There was no stenosis. There was no regurgitation. - Mitral valve: Transvalvular velocity was within the normal range.   There  was no evidence for stenosis. There was mild regurgitation. - Right ventricle: The cavity size was normal. Wall thickness was   normal. Systolic function was normal. - Pulmonary arteries: Systolic pressure was within the normal   range. PA peak pressure: 23 mm Hg (S).  Procedures   Left Heart Cath and Cors/Grafts Angiography  04/15/16   Conclusion     Mid Cx lesion, 60 %stenosed.  Mid LAD to Dist LAD lesion, 100 %stenosed.  Ost 2nd Diag to 2nd Diag lesion, 90 %stenosed.  Origin lesion, 100 %stenosed.  Origin lesion, 100 %stenosed.  Dist RCA lesion, 99 %stenosed     ASSESSMENT AND PLAN:  1.  Angina with CAD cath 04/15/16 patent grafts with 90% stenosis 2nd diag and RCA occluded with collaterals .  No recurrent pain SL NTG prescribe. If pain occurs he will call and imdur will be added.  Follow up with me 6 months  Continue DAT   2. HTN better with 10 mg dose of norvasc   3.  CAD treating medically hx of CABG.  4. CKD 3 with s/p nephrectomy  PCP is referring to nephrology Lab Results  Component Value Date   CREATININE 1.80 (H) 04/16/2016   BUN 24 (H) 04/16/2016   NA 139 04/16/2016   K 4.6 04/16/2016   CL 103 04/16/2016   CO2 29 04/16/2016     5. HLD on fluvastatin will increase to 3 x/week f/u labs in  3 months   6. TIA:  With palpitations if 2 week monitor does not show PAF will have him see our EP to consider ILR although lacunar infarct not likely embolic. Continue aspirin and plavix for now   Current medicines are reviewed with the patient today.  The patient Has no concerns regarding medicines.  The following changes have been made:  See above Labs/ tests ordered today include:see above  Disposition:   FU:  Me in 6 months   Jenkins Rouge

## 2016-06-15 ENCOUNTER — Ambulatory Visit (INDEPENDENT_AMBULATORY_CARE_PROVIDER_SITE_OTHER): Payer: 59 | Admitting: Cardiovascular Disease

## 2016-06-15 ENCOUNTER — Encounter: Payer: Self-pay | Admitting: Cardiovascular Disease

## 2016-06-15 VITALS — BP 150/64 | HR 63 | Ht 71.0 in | Wt 280.4 lb

## 2016-06-15 DIAGNOSIS — I25708 Atherosclerosis of coronary artery bypass graft(s), unspecified, with other forms of angina pectoris: Secondary | ICD-10-CM

## 2016-06-15 NOTE — Patient Instructions (Addendum)
Medication Instructions:  Your physician recommends that you continue on your current medications as directed. Please refer to the Current Medication list given to you today.  Labwork: NONE  Testing/Procedures: NONE  Follow-Up: Your physician wants you to follow-up in: 6 months with Dr. [name]. You will receive a reminder letter in the mail two months in advance. If you don't receive a letter, please call our office to schedule the follow-up appointment.  You have been referred to EP consult for Loop Recorder.  If you need a refill on your cardiac medications before your next appointment, please call your pharmacy.

## 2016-06-22 ENCOUNTER — Ambulatory Visit (INDEPENDENT_AMBULATORY_CARE_PROVIDER_SITE_OTHER): Payer: 59 | Admitting: Internal Medicine

## 2016-06-22 ENCOUNTER — Encounter: Payer: Self-pay | Admitting: Internal Medicine

## 2016-06-22 VITALS — BP 140/80 | HR 78 | Ht 71.0 in | Wt 278.5 lb

## 2016-06-22 DIAGNOSIS — I25708 Atherosclerosis of coronary artery bypass graft(s), unspecified, with other forms of angina pectoris: Secondary | ICD-10-CM

## 2016-06-22 DIAGNOSIS — G451 Carotid artery syndrome (hemispheric): Secondary | ICD-10-CM | POA: Diagnosis not present

## 2016-06-22 NOTE — Patient Instructions (Addendum)
Medication Instructions:  Your physician recommends that you continue on your current medications as directed. Please refer to the Current Medication list given to you today.   Labwork: None ordered   Testing/Procedures: None ordered   Follow-Up: Your physician recommends that you schedule a follow-up appointment to be determined after receive results of Zio patch monitor  LINQ implant scheduled for 06/28/16 at 7:30am Patient to be at [facility] at 6:30am

## 2016-06-23 NOTE — Progress Notes (Signed)
ELECTROPHYSIOLOGY CONSULT NOTE  Patient ID: Erik [name] MRN: [id number], DOB/AGE: [birth year]   Primary Physician: Alfonso Ellis, NP Primary Cardiologist:  Dr [name] (referring) Reason for Consultation: Cryptogenic stroke and palpitations; recommendations regarding Implantable Loop Recorder  History of Present Illness Erik [name] was admitted in early February with acute symptoms of unsteadiness and transient expressive aphasia.  He was felt to have had a cerebrovascular event as the cause.  He was hospitalized and evaluated by neurology. he has been monitored with Zio patch event monitor which did not reveal afib.  He has frequent palpitations and "fluttering" of unclear cause. Inpatient stroke work-up included TEE which was unremarkable.   EP has been asked to evaluate for placement of an implantable loop recorder to monitor for atrial fibrillation.  Past Medical History Past Medical History:  Diagnosis Date  . Cancer [facility])    Renal cancer s/p Right nephrectomy  . Coronary artery disease   . Hypertension   . Renal disorder     Past Surgical History Past Surgical History:  Procedure Laterality Date  . CARDIAC CATHETERIZATION N/A 04/15/2016   Procedure: Left Heart Cath and Cors/Grafts Angiography;  Surgeon: Lorretta Harp, MD;  Location: [place] [facility];  Service: Cardiovascular;  Laterality: N/A;  . CARDIAC SURGERY    . NEPHRECTOMY Right     Allergies/Intolerances Allergies  Allergen Reactions  . Rosuvastatin Other (See Comments)    Generalized pain   Inpatient Medications   Social History Social History   Social History  . Marital status: Single    Spouse name: N/A  . Number of children: N/A  . Years of education: N/A   Occupational History  . Not on file.   Social History Main Topics  . Smoking status: Never Smoker  . Smokeless tobacco: Never Used  . Alcohol use 1.2 oz/week    2 Shots of liquor per week  . Drug use: No  . Sexual activity: Not on file    Other Topics Concern  . Not on file   Social History Narrative  . No narrative on file    Review of Systems General: No chills, fever, night sweats or weight changes  Cardiovascular:  No chest pain, dyspnea on exertion, edema, orthopnea, palpitations, paroxysmal nocturnal dyspnea Dermatological: No rash, lesions or masses Respiratory: No cough, dyspnea Urologic: No hematuria, dysuria Abdominal: No nausea, vomiting, diarrhea, bright red blood per rectum, melena, or hematemesis Neurologic: No visual changes, weakness, changes in mental status All other systems reviewed and are otherwise negative except as noted above.  Physical Exam Blood pressure 140/80, pulse 78, height 5\' 11"  (1.803 m), weight 278 lb 8 oz (126.3 kg), SpO2 98 %.  General: Well developed, well appearing 61 y.o. male in no acute distress. HEENT: Normocephalic, atraumatic. EOMs intact. Sclera nonicteric. Oropharynx clear.  Neck: Supple without bruits. No JVD. Lungs: Respirations regular and unlabored, CTA bilaterally. No wheezes, rales or rhonchi. Heart: RRR. S1, S2 present. No murmurs, rub, S3 or S4. Abdomen: Soft, non-tender, non-distended. BS present x 4 quadrants. No hepatosplenomegaly.  Extremities: No clubbing, cyanosis or edema. DP/PT/Radials 2+ and equal bilaterally. Psych: Normal affect. Neuro: Alert and oriented X 3. Moves all extremities spontaneously. Musculoskeletal: No kyphosis. Skin: Intact. Warm and dry. No rashes or petechiae in exposed areas.   Labs Lab Results  Component Value Date   WBC 6.6 04/16/2016   HGB 15.1 04/16/2016   HCT 44.1 04/16/2016   MCV 93.0 04/16/2016   PLT 152 04/16/2016   No results found.  Cath and echo are reviewed Recent ecg and event monitor are also reviewed   Assessment and Plan 1. Cryptogenic cerebrovascular event/ palpitations I have reviewed event monitor which was unrevealing.  Given concern for afib as the cause for his recent TIA, I think that implantable  loop recorder placement is very reasonable.  This will also be beneficial in evaluation of his palpitations of unclear significance.  The indication for loop recorder insertion / monitoring for AF in setting of cryptogenic stroke was discussed with the patient. The loop recorder insertion procedure was reviewed in detail including risks and benefits. These risks include but are not limited to bleeding and infection. The patient expressed verbal understanding and agrees to proceed.  We will schedule the procedure at the next available time.  2. CAD No ischemic symptoms  Routine wound care and ILR follow-up with remote monitoring I will see as needed going forward after ILR placement.  Signed, [name] MD

## 2016-06-28 ENCOUNTER — Ambulatory Visit (HOSPITAL_COMMUNITY)
Admission: RE | Admit: 2016-06-28 | Discharge: 2016-06-28 | Disposition: A | Discharge status: Home / Self Care | Payer: 59 | Source: Ambulatory Visit | Attending: Internal Medicine | Admitting: Internal Medicine

## 2016-06-28 ENCOUNTER — Encounter (HOSPITAL_COMMUNITY)
Admission: RE | Disposition: A | Discharge status: Home / Self Care | Payer: Self-pay | Source: Ambulatory Visit | Attending: Internal Medicine

## 2016-06-28 ENCOUNTER — Encounter (HOSPITAL_COMMUNITY): Payer: Self-pay | Admitting: Internal Medicine

## 2016-06-28 DIAGNOSIS — I638 Other cerebral infarction: Secondary | ICD-10-CM | POA: Insufficient documentation

## 2016-06-28 DIAGNOSIS — I10 Essential (primary) hypertension: Secondary | ICD-10-CM | POA: Diagnosis not present

## 2016-06-28 DIAGNOSIS — Z85528 Personal history of other malignant neoplasm of kidney: Secondary | ICD-10-CM | POA: Diagnosis not present

## 2016-06-28 DIAGNOSIS — Z905 Acquired absence of kidney: Secondary | ICD-10-CM | POA: Diagnosis not present

## 2016-06-28 DIAGNOSIS — I2510 Atherosclerotic heart disease of native coronary artery without angina pectoris: Secondary | ICD-10-CM | POA: Insufficient documentation

## 2016-06-28 DIAGNOSIS — R002 Palpitations: Secondary | ICD-10-CM | POA: Diagnosis not present

## 2016-06-28 HISTORY — PX: LOOP RECORDER INSERTION: EP1214

## 2016-06-28 SURGERY — LOOP RECORDER INSERTION
Anesthesia: LOCAL

## 2016-06-28 MED ORDER — LIDOCAINE HCL (PF) 1 % IJ SOLN
INTRAMUSCULAR | Status: AC
  Filled 2016-06-28: qty 30

## 2016-06-28 SURGICAL SUPPLY — 2 items
LOOP REVEAL LINQSYS (Prosthesis & Implant Heart) ×2 IMPLANT
PACK LOOP INSERTION (CUSTOM PROCEDURE TRAY) ×2 IMPLANT

## 2016-06-28 NOTE — H&P (View-Only) (Signed)
ELECTROPHYSIOLOGY CONSULT NOTE  Patient ID: Erik [name] MRN: [id number], DOB/AGE: May 09, 1955   Primary Physician: Alfonso Ellis, NP Primary Cardiologist:  Dr [name] (referring) Reason for Consultation: Cryptogenic stroke and palpitations; recommendations regarding Implantable Loop Recorder  History of Present Illness Erik [name] was admitted in early February with acute symptoms of unsteadiness and transient expressive aphasia.  He was felt to have had a cerebrovascular event as the cause.  He was hospitalized and evaluated by neurology. he has been monitored with Zio patch event monitor which did not reveal afib.  He has frequent palpitations and "fluttering" of unclear cause. Inpatient stroke work-up included TEE which was unremarkable.   EP has been asked to evaluate for placement of an implantable loop recorder to monitor for atrial fibrillation.  Past Medical History Past Medical History:  Diagnosis Date  . Cancer [facility])    Renal cancer s/p Right nephrectomy  . Coronary artery disease   . Hypertension   . Renal disorder     Past Surgical History Past Surgical History:  Procedure Laterality Date  . CARDIAC CATHETERIZATION N/A 04/15/2016   Procedure: Left Heart Cath and Cors/Grafts Angiography;  Surgeon: Lorretta Harp, MD;  Location: [place] [facility];  Service: Cardiovascular;  Laterality: N/A;  . CARDIAC SURGERY    . NEPHRECTOMY Right     Allergies/Intolerances Allergies  Allergen Reactions  . Rosuvastatin Other (See Comments)    Generalized pain   Inpatient Medications   Social History Social History   Social History  . Marital status: Single    Spouse name: N/A  . Number of children: N/A  . Years of education: N/A   Occupational History  . Not on file.   Social History Main Topics  . Smoking status: Never Smoker  . Smokeless tobacco: Never Used  . Alcohol use 1.2 oz/week    2 Shots of liquor per week  . Drug use: No  . Sexual activity: Not on file    Other Topics Concern  . Not on file   Social History Narrative  . No narrative on file    Review of Systems General: No chills, fever, night sweats or weight changes  Cardiovascular:  No chest pain, dyspnea on exertion, edema, orthopnea, palpitations, paroxysmal nocturnal dyspnea Dermatological: No rash, lesions or masses Respiratory: No cough, dyspnea Urologic: No hematuria, dysuria Abdominal: No nausea, vomiting, diarrhea, bright red blood per rectum, melena, or hematemesis Neurologic: No visual changes, weakness, changes in mental status All other systems reviewed and are otherwise negative except as noted above.  Physical Exam Blood pressure 140/80, pulse 78, height 5\' 11"  (1.803 m), weight 278 lb 8 oz (126.3 kg), SpO2 98 %.  General: Well developed, well appearing 61 y.o. male in no acute distress. HEENT: Normocephalic, atraumatic. EOMs intact. Sclera nonicteric. Oropharynx clear.  Neck: Supple without bruits. No JVD. Lungs: Respirations regular and unlabored, CTA bilaterally. No wheezes, rales or rhonchi. Heart: RRR. S1, S2 present. No murmurs, rub, S3 or S4. Abdomen: Soft, non-tender, non-distended. BS present x 4 quadrants. No hepatosplenomegaly.  Extremities: No clubbing, cyanosis or edema. DP/PT/Radials 2+ and equal bilaterally. Psych: Normal affect. Neuro: Alert and oriented X 3. Moves all extremities spontaneously. Musculoskeletal: No kyphosis. Skin: Intact. Warm and dry. No rashes or petechiae in exposed areas.   Labs Lab Results  Component Value Date   WBC 6.6 04/16/2016   HGB 15.1 04/16/2016   HCT 44.1 04/16/2016   MCV 93.0 04/16/2016   PLT 152 04/16/2016   No results found.  Cath and echo are reviewed Recent ecg and event monitor are also reviewed   Assessment and Plan 1. Cryptogenic cerebrovascular event/ palpitations I have reviewed event monitor which was unrevealing.  Given concern for afib as the cause for his recent TIA, I think that implantable  loop recorder placement is very reasonable.  This will also be beneficial in evaluation of his palpitations of unclear significance.  The indication for loop recorder insertion / monitoring for AF in setting of cryptogenic stroke was discussed with the patient. The loop recorder insertion procedure was reviewed in detail including risks and benefits. These risks include but are not limited to bleeding and infection. The patient expressed verbal understanding and agrees to proceed.  We will schedule the procedure at the next available time.  2. CAD No ischemic symptoms  Routine wound care and ILR follow-up with remote monitoring I will see as needed going forward after ILR placement.  Signed, [name] MD

## 2016-06-28 NOTE — Interval H&P Note (Signed)
History and Physical Interval Note:  06/28/2016 7:23 AM  Erik [name]  has presented today for surgery, with the diagnosis of stroke  The various methods of treatment have been discussed with the patient and family. After consideration of risks, benefits and other options for treatment, the patient has consented to  Procedure(s): Loop Recorder Insertion (N/A) as a surgical intervention .  The patient's history has been reviewed, patient examined, no change in status, stable for surgery.  I have reviewed the patient's chart and labs.  Questions were answered to the patient's satisfaction.     [name]

## 2016-07-11 ENCOUNTER — Ambulatory Visit: Payer: 59

## 2016-07-13 ENCOUNTER — Ambulatory Visit: Payer: 59 | Admitting: Cardiovascular Disease

## 2016-07-20 ENCOUNTER — Ambulatory Visit (INDEPENDENT_AMBULATORY_CARE_PROVIDER_SITE_OTHER): Payer: 59 | Admitting: *Deleted

## 2016-07-20 DIAGNOSIS — I639 Cerebral infarction, unspecified: Secondary | ICD-10-CM

## 2016-07-20 NOTE — Progress Notes (Signed)
Wound Loop check in clinic. Wound well healed, edges approximated. No redness or edema.  Battery status: Good R-waves 0.82 mV. 0 symptom episodes, 0 tachy episodes, 0 pause episodes, 0 brady episodes. 0 AF episodes. Monthly summary reports and ROV with JA PRN

## 2016-07-27 ENCOUNTER — Telehealth: Payer: Self-pay

## 2016-07-27 ENCOUNTER — Telehealth: Payer: Self-pay | Admitting: Internal Medicine

## 2016-07-27 NOTE — Telephone Encounter (Signed)
New message    Pt is calling to find out about patient assistant from when he had his loop recorder placed.

## 2016-07-27 NOTE — Telephone Encounter (Signed)
Ordered symptom activator to be delivered at [address] [place], [place] [address]  Confirmation # [id number]

## 2016-07-27 NOTE — Telephone Encounter (Signed)
Pt had a Loop recorder implanted on 06/28/16, his PCP   mentioned to pt that he supposed to have a portable pt assistance  so it can record any abnormal heart rhythms episodes. Pt states he didn't even know he supposed to have it. Pt states that on a box that was given to him in the hospital  is a part # [id number] and a serial # [id number] but he does not have the portable pt assistance. Pt wants to be call  about this.

## 2016-07-27 NOTE — Telephone Encounter (Signed)
Spoke with patient who called concerned about his missing symptom activator. I will order him a new one and verified his address. I reviewed how to use the symptom activator. Patient verbalized understanding.

## 2016-07-28 ENCOUNTER — Ambulatory Visit (INDEPENDENT_AMBULATORY_CARE_PROVIDER_SITE_OTHER): Payer: 59 | Admitting: *Deleted

## 2016-07-28 DIAGNOSIS — I639 Cerebral infarction, unspecified: Secondary | ICD-10-CM | POA: Diagnosis not present

## 2016-07-29 NOTE — Progress Notes (Signed)
Carelink Summary Report / Loop Recorder 

## 2016-08-06 LAB — CUP PACEART REMOTE DEVICE CHECK
Date Time Interrogation Session: 20180405153610
Implantable Pulse Generator Implant Date: 20180306

## 2016-08-06 NOTE — Progress Notes (Signed)
Carelink summary report received. Battery status OK. Normal device function. No new symptom episodes, tachy episodes, brady, or pause episodes. No new AF episodes. Monthly summary reports and ROV/PRN 

## 2016-08-29 ENCOUNTER — Ambulatory Visit (INDEPENDENT_AMBULATORY_CARE_PROVIDER_SITE_OTHER): Payer: 59 | Admitting: *Deleted

## 2016-08-29 ENCOUNTER — Other Ambulatory Visit: Payer: Self-pay | Admitting: Internal Medicine

## 2016-08-29 DIAGNOSIS — I639 Cerebral infarction, unspecified: Secondary | ICD-10-CM | POA: Diagnosis not present

## 2016-08-30 NOTE — Progress Notes (Signed)
Carelink Summary Report / Loop Recorder 

## 2016-09-12 LAB — CUP PACEART REMOTE DEVICE CHECK
Implantable Pulse Generator Implant Date: 20180306
MDC IDC SESS DTM: 20180505160914

## 2016-09-26 ENCOUNTER — Ambulatory Visit (INDEPENDENT_AMBULATORY_CARE_PROVIDER_SITE_OTHER): Payer: 59 | Admitting: *Deleted

## 2016-09-26 DIAGNOSIS — I639 Cerebral infarction, unspecified: Secondary | ICD-10-CM

## 2016-09-27 NOTE — Progress Notes (Signed)
Carelink Summary Report / Loop Recorder 

## 2016-09-28 LAB — CUP PACEART REMOTE DEVICE CHECK
Implantable Pulse Generator Implant Date: 20180306
MDC IDC SESS DTM: 20180604171040

## 2016-10-27 ENCOUNTER — Ambulatory Visit (INDEPENDENT_AMBULATORY_CARE_PROVIDER_SITE_OTHER): Payer: 59 | Admitting: *Deleted

## 2016-10-27 DIAGNOSIS — I639 Cerebral infarction, unspecified: Secondary | ICD-10-CM

## 2016-10-28 NOTE — Progress Notes (Signed)
Carelink Summary Report / Loop Recorder 

## 2016-10-31 LAB — CUP PACEART REMOTE DEVICE CHECK
MDC IDC PG IMPLANT DT: 20180306
MDC IDC SESS DTM: 20180704161145

## 2016-11-09 ENCOUNTER — Telehealth: Payer: Self-pay | Admitting: Cardiology

## 2016-11-09 NOTE — Telephone Encounter (Signed)
LMOVM requesting that pt send manual transmission b/c home monitor has not updated in at least 14 days.    

## 2016-11-18 ENCOUNTER — Encounter: Payer: Self-pay | Admitting: Cardiology

## 2016-11-25 ENCOUNTER — Ambulatory Visit (INDEPENDENT_AMBULATORY_CARE_PROVIDER_SITE_OTHER): Payer: 59 | Admitting: *Deleted

## 2016-11-25 DIAGNOSIS — I639 Cerebral infarction, unspecified: Secondary | ICD-10-CM | POA: Diagnosis not present

## 2016-11-29 NOTE — Progress Notes (Signed)
Carelink Summary Report / Loop Recorder 

## 2016-12-06 LAB — CUP PACEART REMOTE DEVICE CHECK
Date Time Interrogation Session: 20180803163931
MDC IDC PG IMPLANT DT: 20180306

## 2016-12-27 ENCOUNTER — Ambulatory Visit (INDEPENDENT_AMBULATORY_CARE_PROVIDER_SITE_OTHER): Payer: 59 | Admitting: *Deleted

## 2016-12-27 DIAGNOSIS — I639 Cerebral infarction, unspecified: Secondary | ICD-10-CM

## 2016-12-28 NOTE — Progress Notes (Signed)
Carelink Summary Report / Loop Recorder 

## 2016-12-29 LAB — CUP PACEART REMOTE DEVICE CHECK
Implantable Pulse Generator Implant Date: 20180306
MDC IDC SESS DTM: 20180902174111

## 2017-01-04 ENCOUNTER — Telehealth: Payer: Self-pay | Admitting: Cardiology

## 2017-01-04 NOTE — Telephone Encounter (Signed)
Spoke w/ pt and requested that he send a manual transmission b/c his home monitor has not updated in at least 14 days.   

## 2017-01-18 ENCOUNTER — Telehealth: Payer: Self-pay | Admitting: Cardiology

## 2017-01-18 NOTE — Telephone Encounter (Signed)
Spoke w/ pt and requested that he send a manual transmission b/c his home monitor has not updated in at least 14 days.   

## 2017-01-24 ENCOUNTER — Ambulatory Visit (INDEPENDENT_AMBULATORY_CARE_PROVIDER_SITE_OTHER): Payer: 59 | Admitting: *Deleted

## 2017-01-24 DIAGNOSIS — I639 Cerebral infarction, unspecified: Secondary | ICD-10-CM | POA: Diagnosis not present

## 2017-01-25 NOTE — Progress Notes (Signed)
Carelink Summary Report / Loop Recorder 

## 2017-01-26 LAB — CUP PACEART REMOTE DEVICE CHECK
Implantable Pulse Generator Implant Date: 20180306
MDC IDC SESS DTM: 20181002181012

## 2017-02-23 ENCOUNTER — Ambulatory Visit (INDEPENDENT_AMBULATORY_CARE_PROVIDER_SITE_OTHER): Payer: 59 | Admitting: *Deleted

## 2017-02-23 DIAGNOSIS — I639 Cerebral infarction, unspecified: Secondary | ICD-10-CM

## 2017-02-24 NOTE — Progress Notes (Signed)
Carelink Summary Report / Loop Recorder 

## 2017-02-27 LAB — CUP PACEART REMOTE DEVICE CHECK
Implantable Pulse Generator Implant Date: 20180306
MDC IDC SESS DTM: 20181101184046

## 2017-03-27 ENCOUNTER — Ambulatory Visit (INDEPENDENT_AMBULATORY_CARE_PROVIDER_SITE_OTHER): Payer: 59 | Admitting: *Deleted

## 2017-03-27 DIAGNOSIS — I639 Cerebral infarction, unspecified: Secondary | ICD-10-CM | POA: Diagnosis not present

## 2017-03-27 NOTE — Progress Notes (Signed)
Carelink Summary Report / Loop Recorder 

## 2017-04-04 LAB — CUP PACEART REMOTE DEVICE CHECK
Implantable Pulse Generator Implant Date: 20180306
MDC IDC SESS DTM: 20181201191047

## 2017-04-12 ENCOUNTER — Telehealth: Payer: Self-pay | Admitting: Cardiology

## 2017-04-12 NOTE — Telephone Encounter (Signed)
LMOVM requesting that pt send manual transmission b/c home monitor has not updated in at least 14 days.    

## 2017-04-24 ENCOUNTER — Ambulatory Visit (INDEPENDENT_AMBULATORY_CARE_PROVIDER_SITE_OTHER): Payer: 59 | Admitting: *Deleted

## 2017-04-24 DIAGNOSIS — I639 Cerebral infarction, unspecified: Secondary | ICD-10-CM | POA: Diagnosis not present

## 2017-04-26 NOTE — Progress Notes (Signed)
Carelink Summary Report / Loop Recorder 

## 2017-05-02 ENCOUNTER — Telehealth: Payer: Self-pay | Admitting: Cardiology

## 2017-05-02 NOTE — Telephone Encounter (Signed)
Spoke w/ pt and requested that he send a manual transmission b/c his home monitor has not updated in at least 14 days.   

## 2017-05-09 LAB — CUP PACEART REMOTE DEVICE CHECK
MDC IDC PG IMPLANT DT: 20180306
MDC IDC SESS DTM: 20181231190915

## 2017-05-24 ENCOUNTER — Ambulatory Visit (INDEPENDENT_AMBULATORY_CARE_PROVIDER_SITE_OTHER): Payer: 59 | Admitting: *Deleted

## 2017-05-24 DIAGNOSIS — I639 Cerebral infarction, unspecified: Secondary | ICD-10-CM | POA: Diagnosis not present

## 2017-05-25 NOTE — Progress Notes (Signed)
Carelink Summary Report / Loop Recorder 

## 2017-06-06 LAB — CUP PACEART REMOTE DEVICE CHECK
Date Time Interrogation Session: 20190130203859
MDC IDC PG IMPLANT DT: 20180306

## 2017-06-07 ENCOUNTER — Telehealth: Payer: Self-pay | Admitting: Cardiology

## 2017-06-07 NOTE — Telephone Encounter (Signed)
Spoke w/ pt and requested that he send a manual transmission b/c his home monitor has not updated in at least 14 days.   

## 2017-06-26 ENCOUNTER — Ambulatory Visit (INDEPENDENT_AMBULATORY_CARE_PROVIDER_SITE_OTHER): Payer: 59 | Admitting: *Deleted

## 2017-06-26 DIAGNOSIS — I639 Cerebral infarction, unspecified: Secondary | ICD-10-CM

## 2017-06-27 NOTE — Progress Notes (Signed)
Carelink Summary Report / Loop Recorder 

## 2017-07-25 ENCOUNTER — Telehealth: Payer: Self-pay | Admitting: Cardiology

## 2017-07-25 NOTE — Telephone Encounter (Signed)
LMOVM requesting that pt send manual transmission b/c home monitor has not updated in at least 14 days.    

## 2017-07-31 ENCOUNTER — Ambulatory Visit (INDEPENDENT_AMBULATORY_CARE_PROVIDER_SITE_OTHER): Payer: 59 | Admitting: *Deleted

## 2017-07-31 DIAGNOSIS — I639 Cerebral infarction, unspecified: Secondary | ICD-10-CM

## 2017-08-01 NOTE — Progress Notes (Signed)
Carelink Summary Report / Loop Recorder 

## 2017-08-08 LAB — CUP PACEART REMOTE DEVICE CHECK
Implantable Pulse Generator Implant Date: 20180306
MDC IDC SESS DTM: 20190304231020

## 2017-08-31 ENCOUNTER — Ambulatory Visit (INDEPENDENT_AMBULATORY_CARE_PROVIDER_SITE_OTHER): Payer: 59 | Admitting: *Deleted

## 2017-08-31 DIAGNOSIS — I639 Cerebral infarction, unspecified: Secondary | ICD-10-CM

## 2017-08-31 LAB — CUP PACEART REMOTE DEVICE CHECK
Implantable Pulse Generator Implant Date: 20180306
MDC IDC SESS DTM: 20190406234118

## 2017-09-04 NOTE — Progress Notes (Signed)
Carelink Summary Report / Loop Recorder 

## 2017-09-12 ENCOUNTER — Telehealth: Payer: Self-pay | Admitting: Cardiology

## 2017-09-12 NOTE — Telephone Encounter (Signed)
LMOVM requesting that pt send manual transmission b/c home monitor has not updated in at least 14 days.    

## 2017-09-25 LAB — CUP PACEART REMOTE DEVICE CHECK
MDC IDC PG IMPLANT DT: 20180306
MDC IDC SESS DTM: 20190510000930

## 2017-09-25 NOTE — Progress Notes (Signed)
Cardiology Office Note   Date:  09/26/2017   ID:  Erik [name], DOB 1955/05/30, MRN [id number]  PCP:  Erik Ellis, NP  Cardiologist:  Dr. Dorthula [name]    Now Dr. Johnsie [name]   No chief complaint on file.     History of Present Illness: Erik [name] is a 62 y.o. male who presents for f/u CVA and CAD/CABG 2009 And CRF with Cr 1.8 post right nephrectomy 2007. Last cath Dr Erik [name] 04/15/16 with occluded RCA and circumflex vein graft consistent with his previously known anatomy. His LIMA to his LAD is patent and a vein to the diagonal branch is patent as well. He does have a small first diagonal branch arising from the mid vessel just prior to occlusion of the LAD which is a 1.7 mm vessel and has a 90% ostial/proximal stenosis. This may indeed be the cause of his anterolateral ischemic abnormality on Myoview. His RCA has high-grade distal disease with collaterals from the left    Hospitalized at [facility]  2018 for ? TIA. Had dizziness and feeling of inability to talk coherantly MRI with lacunar infarct. TEE no SOE normal EF He does get Palpitations and skips. Had Zio monitor with no arrhythmia or PAF  Referred to Dr Erik [name] and ILR placed march 2018    One of his biggest issues is lack of HLD control He has been tried on multiple statins over the last 3 years including lipitor, zocor, and also zetia. He was also tried on low dose and qod crestor and now lescol and cannot tolerate any statin with severe fatigue and muscle pain. Unfortunately his insurance company denied repatha which makes no sense to me   Last lipids done 07/20/17 TC 212 LDL can't calculate due to TG previously 11/22/16 LDL was 107 HDL 33 and Trig 529  Past Medical History:  Diagnosis Date  . Cancer [facility])    Renal cancer s/p Right nephrectomy  . Coronary artery disease   . Hypertension   . Renal disorder     Past Surgical History:  Procedure Laterality Date  . CARDIAC CATHETERIZATION N/A 04/15/2016   Procedure:  Left Heart Cath and Cors/Grafts Angiography;  Surgeon: Lorretta Harp, MD;  Location: [facility];  Service: Cardiovascular;  Laterality: N/A;  . CARDIAC SURGERY    . LOOP RECORDER INSERTION N/A 06/28/2016   Procedure: Loop Recorder Insertion;  Surgeon: [name], MD;  Location: [place] [facility];  Service: Cardiovascular;  Laterality: N/A;  . NEPHRECTOMY Right      Current Outpatient Medications  Medication Sig Dispense Refill  . acetaminophen (TYLENOL) 500 MG tablet Take 500-1,000 mg by mouth every 6 (six) hours as needed (for arthritic pain).    Marland Kitchen amLODipine (NORVASC) 10 MG tablet Take 10 mg by mouth as directed.    Marland Kitchen aspirin 325 MG tablet Take 325 mg by mouth daily.    . Cholecalciferol (VITAMIN D3) 2000 units capsule Take 2,000 Units by mouth daily.    . clopidogrel (PLAVIX) 75 MG tablet Take 75 mg by mouth daily.    . DOCOSAHEXAENOIC ACID PO Take by mouth as directed.    . furosemide (LASIX) 20 MG tablet Take 20 mg by mouth daily.    Marland Kitchen lisinopril (PRINIVIL,ZESTRIL) 5 MG tablet Take 1 tablet (5 mg total) by mouth daily. 30 tablet 3  . magnesium oxide (MAG-OX) 400 MG tablet Take 400 mg by mouth 2 (two) times daily.    . metoprolol succinate (TOPROL-XL) 100 MG 24  hr tablet Take 100 mg by mouth daily.    . nitroGLYCERIN (NITROSTAT) 0.4 MG SL tablet Place 0.4 mg under the tongue every 5 (five) minutes as needed for chest pain.    . Soft Lens Products (B & L SENSITIVE EYES SALINE) 0.4 % SOLN Place 1-2 drops into both eyes daily as needed (for contact lenses).    . TURMERIC PO Take by mouth as directed.    . vitamin B-12 (CYANOCOBALAMIN) 250 MCG tablet Take by mouth as directed.    . fluvastatin (LESCOL) 40 MG capsule Take 40 mg by mouth at bedtime.      No current facility-administered medications for this visit.     Allergies:   Rosuvastatin and Zetia [ezetimibe]    Social History:  The patient  reports that he has never smoked. He has never used smokeless tobacco. He  reports that he drinks about 1.2 oz of alcohol per week. He reports that he does not use drugs.   Family History:  The patient's family history includes Heart attack in his father; Heart attack (age of onset: 13) in his brother.    ROS:  General:no colds or fevers, no weight changes Skin:no rashes or ulcers HEENT:no blurred vision, no congestion CV:see HPI PUL:see HPI GI:no diarrhea constipation or melena, no indigestion GU:no hematuria, no dysuria MS:no joint pain, no claudication Neuro:no syncope, no lightheadedness Endo:no diabetes, no thyroid disease  Wt Readings from Last 3 Encounters:  09/26/17 286 lb 8 oz (130 kg)  06/28/16 278 lb (126.1 kg)  06/22/16 278 lb 8 oz (126.3 kg)     PHYSICAL EXAM: VS:  BP (!) 148/68   Pulse 74   Ht 5\' 11"  (1.803 m)   Wt 286 lb 8 oz (130 kg)   SpO2 97%   BMI 39.96 kg/m  , BMI Body mass index is 39.96 kg/m. General:Pleasant affect, NAD Skin:Warm and dry, brisk capillary refill HEENT:normocephalic, sclera clear, mucus membranes moist Neck:supple, no JVD, no bruits  Heart:S1S2 RRR without murmur, gallup, rub or click Lungs:clear without rales, rhonchi, or wheezes OZH:YQMV, non tender, + BS, do not palpate liver spleen or masses Ext:no lower ext edema, 2+ pedal pulses, 2+ radial pulses Neuro:alert and oriented, MAE, follows commands, + facial symmetry    EKG:    The ekg ordered today demonstrates SR rate 69 with 1st degree AV block no acute changes.   Recent Labs: No results [name] for requested labs within last 8760 hours.    Lipid Panel    Component Value Date/Time   CHOL 214 (H) 04/15/2016 0339   TRIG 283 (H) 04/15/2016 0339   HDL 40 (L) 04/15/2016 0339   CHOLHDL 5.4 04/15/2016 0339   VLDL 57 (H) 04/15/2016 0339   LDLCALC 117 (H) 04/15/2016 7846       Other studies Reviewed: Additional studies/ records that were reviewed today include: .  ECHO: Study Conclusions  - Left ventricle: The cavity size was normal. There  was moderate   concentric hypertrophy. Systolic function was mildly reduced. The   estimated ejection fraction was in the range of 45% to 50%. Mild   hypokinesis of the anteroseptal, anterior, and inferoseptal   myocardium. Features are consistent with a pseudonormal left   ventricular filling pattern, with concomitant abnormal relaxation   and increased filling pressure (grade 2 diastolic dysfunction). - Aortic valve: Transvalvular velocity was within the normal range.   There was no stenosis. There was no regurgitation. - Mitral valve: Transvalvular velocity was within the  normal range.   There was no evidence for stenosis. There was mild regurgitation. - Right ventricle: The cavity size was normal. Wall thickness was   normal. Systolic function was normal. - Pulmonary arteries: Systolic pressure was within the normal   range. PA peak pressure: 23 mm Hg (S).  Procedures   Left Heart Cath and Cors/Grafts Angiography  04/15/16   Conclusion     Mid Cx lesion, 60 %stenosed.  Mid LAD to Dist LAD lesion, 100 %stenosed.  Ost 2nd Diag to 2nd Diag lesion, 90 %stenosed.  Origin lesion, 100 %stenosed.  Origin lesion, 100 %stenosed.  Dist RCA lesion, 99 %stenosed     ASSESSMENT AND PLAN:  1.  CAD/CABG  cath 04/15/16 patent grafts with 90% stenosis 2nd diag and RCA occluded with collaterals . LIMA to LAD and SVG to Diagonal patient Known occlusion of SVG's to RCA and circumflex  No recurrent pain SL NTG prescribe. If pain occurs he will call and imdur will be added.  Follow up with me 6 months  Continue DAT   2. HTN [name] with 10 mg dose of norvasc   3. CKD 3 with s/p nephrectomy  PCP is referring to nephrology baseline Cr 1.8   4. HLD failed multiple statins including zocor, lipitor, lescol and low dose qod crestor Have written letter to insurance company regarding repatha   6. TIA:  ILR placed 07/20/16 no PAF to date   Continue aspirin and plavix for now   Current medicines  are reviewed with the patient today.  The patient Has no concerns regarding medicines.  The following changes have been made:  See above Labs/ tests ordered today include:see above  Disposition:   FU:  Me in 6 months   Jenkins Rouge

## 2017-09-26 ENCOUNTER — Telehealth: Payer: Self-pay

## 2017-09-26 ENCOUNTER — Encounter: Payer: Self-pay | Admitting: Cardiovascular Disease

## 2017-09-26 ENCOUNTER — Ambulatory Visit: Payer: 59 | Admitting: Cardiovascular Disease

## 2017-09-26 VITALS — BP 148/68 | HR 74 | Ht 71.0 in | Wt 286.5 lb

## 2017-09-26 DIAGNOSIS — I25708 Atherosclerosis of coronary artery bypass graft(s), unspecified, with other forms of angina pectoris: Secondary | ICD-10-CM

## 2017-09-26 DIAGNOSIS — I10 Essential (primary) hypertension: Secondary | ICD-10-CM | POA: Diagnosis not present

## 2017-09-26 DIAGNOSIS — E782 Mixed hyperlipidemia: Secondary | ICD-10-CM | POA: Diagnosis not present

## 2017-09-26 DIAGNOSIS — E785 Hyperlipidemia, unspecified: Secondary | ICD-10-CM

## 2017-09-26 NOTE — Patient Instructions (Signed)

## 2017-09-26 NOTE — Telephone Encounter (Signed)
Patient will come in tomorrow around 9:00 am for fasting lipid and liver panel.

## 2017-09-27 ENCOUNTER — Encounter (HOSPITAL_COMMUNITY): Payer: Self-pay | Admitting: Cardiovascular Disease

## 2017-09-27 ENCOUNTER — Other Ambulatory Visit: Payer: 59 | Admitting: *Deleted

## 2017-09-27 DIAGNOSIS — E785 Hyperlipidemia, unspecified: Secondary | ICD-10-CM

## 2017-09-27 LAB — HEPATIC FUNCTION PANEL
ALBUMIN: 4.4 g/dL (ref 3.6–4.8)
ALT: 35 IU/L (ref 0–44)
AST: 24 IU/L (ref 0–40)
Alkaline Phosphatase: 63 IU/L (ref 39–117)
BILIRUBIN TOTAL: 0.4 mg/dL (ref 0.0–1.2)
BILIRUBIN, DIRECT: 0.13 mg/dL (ref 0.00–0.40)
Total Protein: 6.9 g/dL (ref 6.0–8.5)

## 2017-09-27 LAB — LIPID PANEL
CHOLESTEROL TOTAL: 201 mg/dL — AB (ref 100–199)
Chol/HDL Ratio: 6.7 ratio — ABNORMAL HIGH (ref 0.0–5.0)
HDL: 30 mg/dL — ABNORMAL LOW (ref >39)
TRIGLYCERIDES: 440 mg/dL — AB (ref 0–149)

## 2017-09-27 NOTE — Progress Notes (Signed)
To: [facility] Re:  Erik [name] / Repatha  [name] is a patient of [facility]  He has significant CAD with previous CABG and already has had failure of some of his saphenous vein grafts. He has significant hyperlipidemia and has been intolerant to 3 month trials of multiple statins including zocor, lipitor low dose and qod crestor. His LDL cholesterol has consistently been over goal of 70  It is imperative that his lipids get under [name] control to prevent future myocardial infarctions and further coronary graft failure. He is a perfect candidate for Repatha and his denial of coverage letter makes absolutely no sense to me his cardiologist. If you need any further information please contact me or patients primary care provider Dr Alfonso Ellis who has also written a letter on his behalf   Sincerely   Jenkins Rouge  MD [facility]

## 2017-09-28 ENCOUNTER — Telehealth: Payer: Self-pay

## 2017-09-28 DIAGNOSIS — E782 Mixed hyperlipidemia: Secondary | ICD-10-CM

## 2017-09-28 MED ORDER — FENOFIBRATE 145 MG PO TABS: 145.0000 mg | ORAL_TABLET | Freq: Every day | ORAL | 3 refills | Status: DC

## 2017-09-28 NOTE — Telephone Encounter (Signed)
Called patient with lab results. Will send Tricor 145 mg by mouth daily to patient's pharmacy of choice. Will have [facility] call patient to schedule him for lipid clinic. Patient is aware someone will call him to schedule.

## 2017-09-28 NOTE — Telephone Encounter (Signed)
-----   Message from [name], MD sent at 09/27/2017  5:20 PM EDT ----- He is intolerant to statins and zetia I wrote letter today about repatha denial. Start him on tricor 145 mg daily and see if lipid clinic can help with repatha and f/u

## 2017-10-02 ENCOUNTER — Telehealth: Payer: Self-pay

## 2017-10-02 NOTE — Telephone Encounter (Addendum)
**Note De-Identified Barre Aydelott Obfuscation** Letter received Erik [name] fax from [facility] stating that they have approved the pts Fenofibrate PA. Approval good until 10/03/2018. File ID: [id number]  I have notified the pts pharmacy.

## 2017-10-02 NOTE — Telephone Encounter (Signed)
**Note De-Identified Esha Fincher Obfuscation** I have done a Fenofibrate PA through covermymeds. FTD:[id number]

## 2017-10-03 ENCOUNTER — Ambulatory Visit (INDEPENDENT_AMBULATORY_CARE_PROVIDER_SITE_OTHER): Payer: 59 | Admitting: *Deleted

## 2017-10-03 DIAGNOSIS — I639 Cerebral infarction, unspecified: Secondary | ICD-10-CM | POA: Diagnosis not present

## 2017-10-03 MED ORDER — FENOFIBRATE 54 MG PO TABS: 54.0000 mg | ORAL_TABLET | Freq: Every day | ORAL | 11 refills | Status: DC

## 2017-10-03 NOTE — Telephone Encounter (Signed)
Spoke with patient - he will need updated lipid panel once TG have improved < 400 on fenofibrate before PCSK9i will be covered by insurance. He reports picking up fenofibrate 190m daily instead of 1432mdaily (prior auth was needed and patient's PCP completed a prior auth for the 16042mose). Pt inquires if this is the correct dose since he has renal dysfunction and only 1 kidney. His eGFR is only 40, so he will qualify for reduced dose of fenofibrate. New rx sent in for fenofibrate 37m72mily. Will recheck lipids with direct LDL in 2 months. Scheduled pharmacy visit the day after to discuss RepaKingsley

## 2017-10-04 NOTE — Progress Notes (Signed)
Carelink Summary Report / Loop Recorder 

## 2017-10-13 ENCOUNTER — Telehealth: Payer: Self-pay | Admitting: Cardiology

## 2017-10-13 DIAGNOSIS — I10 Essential (primary) hypertension: Secondary | ICD-10-CM

## 2017-10-13 MED ORDER — ISOSORBIDE MONONITRATE ER 30 MG PO TB24: 30.0000 mg | ORAL_TABLET | Freq: Every day | ORAL | 11 refills | Status: DC

## 2017-10-13 MED ORDER — NITROGLYCERIN 0.4 MG SL SUBL: 0.4000 mg | SUBLINGUAL_TABLET | SUBLINGUAL | 3 refills | Status: DC | PRN

## 2017-10-13 NOTE — Telephone Encounter (Signed)
Reviewed with Dr. [name].  Received verbal order to START IMDUR ER 30mg  once daily for elevated B/P.  Dr. [name] recommended that patient can take his prescribed nitro PRN for chest tightness/pain.  Plan to f/u next week with APP/Dr. [name].    Spoke with patient and advised him of recommendations.  He is agreeable to starting [facility] ER 30mg  daily.  Patient is aware to monitor for side effects of hypotension/dizziness.  Patient also requests refill of SL nitro as his prescription expired in 04/2017.  Updated pharmacy to [place] per patient request.  Will send prescriptions electronically.  Patient agrees to seek call our office or seek emergency medical attention for new or worsening symptoms.  Patient verbalizes understanding of all instructions and is appreciative of call.  Will route message to Howie Ill, RN, for assistance scheduling patient.

## 2017-10-13 NOTE — Telephone Encounter (Signed)
Manual transmission reviewed.  Symptom episode ECG from 10/13/17 at 00:58 appears SR/ST w/PVCs.   Spoke with patient.  He reports compliance with all medications.  Episodes on Tuesday and last night both lasted ~10 min.  He has not tried SL nitro.  Patient woke up from sleep both nights because he felt very hot, then symptoms progressed.  He did take an extra ASA.  B/P running at baseline with exception of these episodes.  Advised patient I will review with MD and call him back with further recommendations.  He is appreciative and denies questions at this time.

## 2017-10-13 NOTE — Telephone Encounter (Signed)
Called patient with appointment with Richardson Dopp PA on 10/30/17.

## 2017-10-13 NOTE — Telephone Encounter (Signed)
He woke up this morning and on Tuesday morning around 1:30 AM w/ tightness in his chest, heart rate 104, blood pressure 178/106. Patient sent a remote transmission this morning. Informed pt that I would send Device Tech RN a message and she will review remote transmission and call back w/ results. Pt verbalized understanding.

## 2017-10-25 ENCOUNTER — Ambulatory Visit: Payer: 59 | Admitting: Physician Assistant

## 2017-10-30 ENCOUNTER — Encounter: Payer: Self-pay | Admitting: Physician Assistant

## 2017-10-30 ENCOUNTER — Ambulatory Visit: Payer: 59 | Admitting: Physician Assistant

## 2017-10-30 VITALS — BP 132/86 | HR 79 | Ht 71.0 in | Wt 282.1 lb

## 2017-10-30 DIAGNOSIS — N183 Chronic kidney disease, stage 3 unspecified: Secondary | ICD-10-CM

## 2017-10-30 DIAGNOSIS — I10 Essential (primary) hypertension: Secondary | ICD-10-CM | POA: Diagnosis not present

## 2017-10-30 DIAGNOSIS — I25119 Atherosclerotic heart disease of native coronary artery with unspecified angina pectoris: Secondary | ICD-10-CM

## 2017-10-30 DIAGNOSIS — E785 Hyperlipidemia, unspecified: Secondary | ICD-10-CM | POA: Diagnosis not present

## 2017-10-30 DIAGNOSIS — Z8673 Personal history of transient ischemic attack (TIA), and cerebral infarction without residual deficits: Secondary | ICD-10-CM

## 2017-10-30 DIAGNOSIS — I493 Ventricular premature depolarization: Secondary | ICD-10-CM

## 2017-10-30 MED ORDER — ASPIRIN EC 81 MG PO TBEC: 81.0000 mg | DELAYED_RELEASE_TABLET | Freq: Every day | ORAL | Status: AC

## 2017-10-30 NOTE — Patient Instructions (Addendum)
Medication Instructions:  1. DECREASE ASPIRIN TO 81 MG DAILY  Labwork: TODAY BMET  Testing/Procedures: NONE ORDERED TODAY  Follow-Up: DR. [name] ON 02/19/18 @ 9:45 AM   Any Other Special Instructions Will Be Listed Below (If Applicable).     If you need a refill on your cardiac medications before your next appointment, please call your pharmacy.

## 2017-10-30 NOTE — Progress Notes (Signed)
Cardiology Office Note:    Date:  10/30/2017   ID:  Erik [name], DOB 10/08/1955, MRN [id number]  PCP:  Alfonso Ellis, NP  Cardiologist:  Jenkins Rouge, MD   Referring MD: Alfonso Ellis, NP   Chief Complaint  Patient presents with  . Chest Pain  . Palpitations    History of Present Illness:    Erik [name] is a 62 y.o. male with coronary artery disease status post CABG in 2009, prior stroke, chronic kidney disease status post right nephrectomy in 2007, hypertension.  Cardiac catheterization in December 2017 demonstrated 2 out of 4 grafts patent.  There was a diagonal (not grafted) with 90% ostial stenosis prior to the occlusion of the LAD which may have caused ischemia on the patient's nuclear stress test prior to his cardiac catheterization.  Medical therapy was recommended.  He is status post ILR in March 2018.  He is intolerant to statins.  Last seen by Dr. [name] June 2019.  He called in recently with chest discomfort and elevated blood pressure.  Dr. [name] suggested adding isosorbide and early follow-up.  Erik [name] returns for further evaluation of chest pain and palpitations.  Several weeks ago, he woke up with palpitations.  He checked his blood pressure is 178/106.  He noted that his heart rate was quite rapid.  This is happened 3 other times since then.  It typically awakens him.  He really does not have chest pain.  He does feel anxious when he is having palpitations.  He denies exertional chest discomfort.  He has some dyspnea with activities but no significant change.  He denies orthopnea, PND or edema.  He denies syncope.  He was placed on isosorbide as noted and has felt much [name] since that time.  Of note, he submitted a transmission for his loop recorder today.  This was reviewed by attacks and demonstrated sinus rhythm and sinus tachycardia.  Prior CV studies:   The following studies were reviewed today:  Event monitor 05/27/2016 ([facility]) NSR,  PVCs  Cardiac catheterization 04/15/2016 LAD mid 100; D1 ost 90 LCx mid 60 RCA dist 99 S-OM3 100 S-RPDA 100 L-LAD ok S-D3 ok Med Rx recommended Diagnostic Diagram    Echocardiogram 04/15/2016 Mod conc LVH, EF 45-50, mild ant-sept, ant and inf-sept HK, Gr 2 DD, mild MR, PASP 23  Nuclear stress test 04/14/2016 Large size, moderate severity revesrible anterior, anteroapical, anterolateral and anteroseptal wall defect suggestive of ischemia. LVEF 47% with global hypokinesis. This is an intermediate risk study.    Past Medical History:  Diagnosis Date  . Cancer [facility])    Renal cancer s/p Right nephrectomy  . Chest pain 04/13/2016  . CKD (chronic kidney disease) stage 3, GFR 30-59 ml/min (HCC) 09/01/2015  . Coronary artery disease   . Essential hypertension with goal blood pressure less than 130/85 09/01/2015  . Hx of CABG   . Hypertension   . Renal disorder    Surgical Hx: The patient  has a past surgical history that includes Cardiac surgery; Nephrectomy (Right); Cardiac catheterization (N/A, 04/15/2016); and LOOP RECORDER INSERTION (N/A, 06/28/2016).   Current Medications: Current Meds  Medication Sig  . acetaminophen (TYLENOL) 500 MG tablet Take 500-1,000 mg by mouth every 6 (six) hours as needed (for arthritic pain).  Marland Kitchen amLODipine (NORVASC) 10 MG tablet Take 10 mg by mouth as directed.  . Cholecalciferol (VITAMIN D3) 2000 units capsule Take 2,000 Units by mouth daily.  . clopidogrel (PLAVIX) 75 MG tablet Take  75 mg by mouth daily.  . DOCOSAHEXAENOIC ACID PO Take by mouth as directed.  . fenofibrate 54 MG tablet Take 1 tablet (54 mg total) by mouth daily.  . furosemide (LASIX) 20 MG tablet Take 20 mg by mouth daily.  . isosorbide mononitrate (IMDUR) 30 MG 24 hr tablet Take 1 tablet (30 mg total) by mouth daily.  Marland Kitchen lisinopril (PRINIVIL,ZESTRIL) 5 MG tablet Take 1 tablet (5 mg total) by mouth daily.  . magnesium oxide (MAG-OX) 400 MG tablet Take 400 mg by mouth 2 (two) times  daily.  . metoprolol succinate (TOPROL-XL) 100 MG 24 hr tablet Take 100 mg by mouth daily.  . nitroGLYCERIN (NITROSTAT) 0.4 MG SL tablet Place 1 tablet (0.4 mg total) under the tongue every 5 (five) minutes as needed for chest pain.  . Soft Lens Products (B & L SENSITIVE EYES SALINE) 0.4 % SOLN Place 1-2 drops into both eyes daily as needed (for contact lenses).  . TURMERIC PO Take by mouth as directed.  . vitamin B-12 (CYANOCOBALAMIN) 250 MCG tablet Take by mouth as directed.  . [DISCONTINUED] aspirin 325 MG tablet Take 325 mg by mouth daily.     Allergies:   Rosuvastatin and Zetia [ezetimibe]   Social History   Tobacco Use  . Smoking status: Never Smoker  . Smokeless tobacco: Never Used  Substance Use Topics  . Alcohol use: Yes    Alcohol/week: 1.2 oz    Types: 2 Shots of liquor per week  . Drug use: No     Family Hx: The patient's family history includes Heart attack in his father; Heart attack (age of onset: 79) in his brother.  ROS:   Please see the history of present illness.    Review of Systems  Cardiovascular: Positive for palpitations.   All other systems reviewed and are negative.   EKGs/Labs/Other Test Reviewed:    EKG:  EKG is  ordered today.  The ekg ordered today demonstrates normal sinus rhythm, heart rate 79, normal axis, QTC 431, similar to tracing dated 09/26/2017  Recent Labs: 09/27/2017: ALT 35   Recent Lipid Panel Lab Results  Component Value Date/Time   CHOL 201 (H) 09/27/2017 10:22 AM   TRIG 440 (H) 09/27/2017 10:22 AM   HDL 30 (L) 09/27/2017 10:22 AM   CHOLHDL 6.7 (H) 09/27/2017 10:22 AM   CHOLHDL 5.4 04/15/2016 03:39 AM   LDLCALC Comment 09/27/2017 10:22 AM    Physical Exam:    VS:  BP 132/86   Pulse 79   Ht 5\' 11"  (1.803 m)   Wt 282 lb 1.9 oz (128 kg)   SpO2 96%   BMI 39.35 kg/m     Wt Readings from Last 3 Encounters:  10/30/17 282 lb 1.9 oz (128 kg)  09/26/17 286 lb 8 oz (130 kg)  06/28/16 278 lb (126.1 kg)     Physical Exam    Constitutional: He is oriented to person, place, and time. He appears well-developed and well-nourished. No distress.  HENT:  Head: Normocephalic and atraumatic.  Neck: Neck supple. No JVD present.  Cardiovascular: Normal rate, regular rhythm, S1 normal and S2 normal.  No murmur heard. Pulmonary/Chest: Breath sounds normal. He has no rales.  Abdominal: Soft. There is no hepatomegaly.  Musculoskeletal: He exhibits no edema.  Neurological: He is alert and oriented to person, place, and time.  Skin: Skin is warm and dry.    ASSESSMENT & PLAN:    Coronary artery disease with angina pectoris ([place])  History  of CABG in 2009.  Cardiac catheterization in December 2017 demonstrated 2 out of 4 grafts patent.  He did have a non-graft to diagonal with tight stenosis.  This was a small vessel and it was felt that it should be treated medically.  When the patient was last seen by Dr. [name] in June, he suggested adding long-acting nitrates if he became symptomatic.  He presents today for further evaluation of palpitations.  He has had some chest discomfort but no clearly anginal symptoms.  In any event, he clearly feels [name] on isosorbide.  His blood pressure is also improved on this regimen.  He denies any exertional symptoms.  At this point, I have recommended continuing his current medical therapy which includes amlodipine, aspirin, Plavix, isosorbide, metoprolol.  Follow-up with Dr. [name] in 2 to 3 months.  Essential hypertension  The patient's blood pressure is controlled on his current regimen.  Continue current therapy.   CKD (chronic kidney disease) stage 3, GFR 30-59 ml/min (HCC)  Recent creatinine stable.  Obtain follow-up BMET today.  Hyperlipidemia, unspecified hyperlipidemia type Followed by the lipid clinic.  He was recently placed on fenofibrate.  Of note, his initial episode of palpitations occurred after his first dose.  He decided to hold off on taking this medication since.  He  plans to resume it in the next couple of days.  History of stroke As noted, loop recorder was interrogated today.  Since these episodes started, he has had sinus rhythm, sinus tachycardia and PVCs noted.  PVC's (premature ventricular contractions) Currently, his symptoms are controlled on his current medical regimen.  If he continues to have symptomatic PVCs, we could consider increasing his metoprolol to 150 mg daily.  He has noted some lower blood pressures recently since starting on isosorbide.  We could consider decreasing amlodipine to 5 mg daily to allow increasing his metoprolol succinate to 150 mg daily.   Dispo:  Return in about 3 months (around 01/30/2018) for Routine Follow Up w/ Dr. [name].   Medication Adjustments/Labs and Tests Ordered: Current medicines are reviewed at length with the patient today.  Concerns regarding medicines are outlined above.  Tests Ordered: Orders Placed This Encounter  Procedures  . Basic Metabolic Panel (BMET)  . EKG 12-Lead   Medication Changes: Meds ordered this encounter  Medications  . aspirin EC 81 MG tablet    Sig: Take 1 tablet (81 mg total) by mouth daily.    Signed, Richardson Dopp, PA-C  10/30/2017 5:15 PM    [facility] [address], [place], [place]  [address] Phone: [phone]; Fax: [phone]

## 2017-10-31 LAB — BASIC METABOLIC PANEL
BUN / CREAT RATIO: 10 (ref 10–24)
BUN: 21 mg/dL (ref 8–27)
CHLORIDE: 103 mmol/L (ref 96–106)
CO2: 22 mmol/L (ref 20–29)
CREATININE: 2.05 mg/dL — AB (ref 0.76–1.27)
Calcium: 9.4 mg/dL (ref 8.6–10.2)
GFR calc Af Amer: 39 mL/min/{1.73_m2} — ABNORMAL LOW (ref >59)
GFR calc non Af Amer: 34 mL/min/{1.73_m2} — ABNORMAL LOW (ref >59)
Glucose: 99 mg/dL (ref 65–99)
Potassium: 4.9 mmol/L (ref 3.5–5.2)
SODIUM: 140 mmol/L (ref 134–144)

## 2017-11-06 ENCOUNTER — Ambulatory Visit (INDEPENDENT_AMBULATORY_CARE_PROVIDER_SITE_OTHER): Payer: 59 | Admitting: *Deleted

## 2017-11-06 DIAGNOSIS — I639 Cerebral infarction, unspecified: Secondary | ICD-10-CM

## 2017-11-06 NOTE — Progress Notes (Signed)
Carelink Summary Report / Loop Recorder 

## 2017-11-09 ENCOUNTER — Other Ambulatory Visit: Payer: Self-pay | Admitting: Internal Medicine

## 2017-11-09 LAB — CUP PACEART REMOTE DEVICE CHECK
Date Time Interrogation Session: 20190612003539
Implantable Pulse Generator Implant Date: 20180306

## 2017-12-04 ENCOUNTER — Other Ambulatory Visit: Payer: 59 | Admitting: *Deleted

## 2017-12-04 DIAGNOSIS — E782 Mixed hyperlipidemia: Secondary | ICD-10-CM

## 2017-12-04 LAB — LDL CHOLESTEROL, DIRECT: LDL DIRECT: 124 mg/dL — AB (ref 0–99)

## 2017-12-04 LAB — LIPID PANEL
CHOLESTEROL TOTAL: 212 mg/dL — AB (ref 100–199)
Chol/HDL Ratio: 6.1 ratio — ABNORMAL HIGH (ref 0.0–5.0)
HDL: 35 mg/dL — ABNORMAL LOW (ref >39)
LDL Calculated: 109 mg/dL — ABNORMAL HIGH (ref 0–99)
Triglycerides: 341 mg/dL — ABNORMAL HIGH (ref 0–149)
VLDL Cholesterol Cal: 68 mg/dL — ABNORMAL HIGH (ref 5–40)

## 2017-12-04 LAB — HEPATIC FUNCTION PANEL
ALBUMIN: 4.3 g/dL (ref 3.6–4.8)
ALK PHOS: 57 IU/L (ref 39–117)
ALT: 26 IU/L (ref 0–44)
AST: 21 IU/L (ref 0–40)
BILIRUBIN, DIRECT: 0.13 mg/dL (ref 0.00–0.40)
Bilirubin Total: 0.3 mg/dL (ref 0.0–1.2)
TOTAL PROTEIN: 7 g/dL (ref 6.0–8.5)

## 2017-12-05 ENCOUNTER — Ambulatory Visit (INDEPENDENT_AMBULATORY_CARE_PROVIDER_SITE_OTHER): Payer: 59 | Admitting: Pharmacist

## 2017-12-05 DIAGNOSIS — E785 Hyperlipidemia, unspecified: Secondary | ICD-10-CM

## 2017-12-05 NOTE — Patient Instructions (Addendum)
It was great seeing you today!  Today we discussed Praluent and Repatha which are both PCSK9 inhibitors. We will contact your insurance to see which one your insurance covers. We will also try to see if your insurance covers Vascepa, to lower your triglycerides. If it goes through, you can discontinue your over-the-counter fish oil/ omega-3 supplement. We will contact you after we contact your insurance company. Try to also incorporate more vegetables and salads into your diet. If you have any questions, you can call the lipid clinic at [phone].

## 2017-12-05 NOTE — Progress Notes (Signed)
Patient ID: Erik [name]                 DOB: [birth year]                    MRN: [id number]     HPI: Erik [name] is a 62 y.o. male patient of Dr. [name] that presents today for lipid evaluation.  PMH includes CAD s/p CABG in 2009, prior stroke, CKD s/p nephrectomy in 2007, HTN, HLD. Was last seen in clinic on 10/30/17 for heart palpitations and chest discomfort. He was recently started on fenofibrate in June, however noted that the palpitations started after taking the first dose and stopped the medication. At July clinic visit, patient was planning to restart fenofibrate. Of note, patient has failed multiple statins, however his insurance denied coverage of Repatha.   Patient presents to clinic for lipid management. Patient started fenofibrate 160 mg prescribed by his PCP, however he read the informational papers and called the lipid clinic who changed him to the fenofibrate 54 mg. He had an "event" after he took the first dose of fenofibrate and stopped taking it until last month. He has since restarted the fenofibrate and has been on it for about a month. He reports that he still has joint stiffness but not as bad as it was on the statins. When previously tried statins, he had severe joint pain and couldn't walk. When he stopped the statins, the joint pain got better.  Risk Factors: CAD, HLD, HTN, hx of stroke LDL Goal: <70 mg/dL  Current Medications: fenofibrate 54 mg daily, DHA as directed Intolerances: rosuvastatin (has tried low dose and every other day)- joint pain, ezetimibe- feels bad all over, atorvastatin- pain, fluvastatin- athralgias/myalgias, simvastatin- muscle pain  Diet: states that his diet "sucks". Doesn't cook at home a lot because it is "hard cooking for one". Eats hot dogs, microwave meals, sometimes pizza. Sometimes eats vegetables. Mostly drinks water, rarely drinks Coke, drinks 2 cups of coffee in the morning with 1 spoon of sugar. Does not eat breakfast, sometimes doesn't  eat lunch.   Exercise: does not exercise, climbs the stairs. Walks around his office, has step tracker averages 3,000-5,000 steps per day.  Family History: father- heart attack, brother- heart attack (at age 8)  Social History: never smoker, does not drink alcohol anymore  Labs:  12/04/17: TC 212, TG 341, HDL 35, direct LDL 124 (on fenofibrate) 09/27/17: TC 201, TG 440, HDL 30, LDL incalculable   Past Medical History:  Diagnosis Date  . Cancer [facility])    Renal cancer s/p Right nephrectomy  . Chest pain 04/13/2016  . CKD (chronic kidney disease) stage 3, GFR 30-59 ml/min (HCC) 09/01/2015  . Coronary artery disease   . Essential hypertension with goal blood pressure less than 130/85 09/01/2015  . Hx of CABG   . Hypertension   . Renal disorder     Current Outpatient Medications on File Prior to Visit  Medication Sig Dispense Refill  . acetaminophen (TYLENOL) 500 MG tablet Take 500-1,000 mg by mouth every 6 (six) hours as needed (for arthritic pain).    Marland Kitchen amLODipine (NORVASC) 10 MG tablet Take 10 mg by mouth as directed.    Marland Kitchen aspirin EC 81 MG tablet Take 1 tablet (81 mg total) by mouth daily.    . Cholecalciferol (VITAMIN D3) 2000 units capsule Take 2,000 Units by mouth daily.    . clopidogrel (PLAVIX) 75 MG tablet Take 75 mg by mouth daily.    Marland Kitchen  DOCOSAHEXAENOIC ACID PO Take by mouth as directed.    . fenofibrate 54 MG tablet Take 1 tablet (54 mg total) by mouth daily. 30 tablet 11  . furosemide (LASIX) 20 MG tablet Take 20 mg by mouth daily.    . isosorbide mononitrate (IMDUR) 30 MG 24 hr tablet Take 1 tablet (30 mg total) by mouth daily. 30 tablet 11  . lisinopril (PRINIVIL,ZESTRIL) 5 MG tablet Take 1 tablet (5 mg total) by mouth daily. 30 tablet 3  . magnesium oxide (MAG-OX) 400 MG tablet Take 400 mg by mouth 2 (two) times daily.    . metoprolol succinate (TOPROL-XL) 100 MG 24 hr tablet Take 100 mg by mouth daily.    . nitroGLYCERIN (NITROSTAT) 0.4 MG SL tablet Place 1 tablet (0.4 mg  total) under the tongue every 5 (five) minutes as needed for chest pain. 25 tablet 3  . Soft Lens Products (B & L SENSITIVE EYES SALINE) 0.4 % SOLN Place 1-2 drops into both eyes daily as needed (for contact lenses).    . TURMERIC PO Take by mouth as directed.    . vitamin B-12 (CYANOCOBALAMIN) 250 MCG tablet Take by mouth as directed.     No current facility-administered medications on file prior to visit.     Allergies  Allergen Reactions  . Rosuvastatin Other (See Comments)    Generalized pain  . Zetia [Ezetimibe]     Feels bad all over     Assessment/Plan: Hyperlipidemia: LDL currently not at goal (LDL<70). Discussed PCKS9i with patient, he states that his insurance company sent a letter that they just needed his LDL. Will complete the prior authorization process for both Repatha and Praluent to see which his insurance prefers. He is aggreeable to starting either and feels comfortable giving himself the injection. Provided medication education about injection, side effects, and MOA for Repatha and Praluent. Discussed switching OTC Fish Oil to Vascepa if covered or Lovaza which paitent was also agreeable to. Educated patient on diet and its effects on cholesterol. Will contact patient after prior authorizations return to discuss starting the medication.   Thank you,  Lelan Pons. Patterson Hammersmith, [facility]  12/05/2017 9:10 AM  Patient seen with Danella Penton, PharmD Candidate.

## 2017-12-07 ENCOUNTER — Telehealth: Payer: Self-pay | Admitting: Pharmacist

## 2017-12-07 ENCOUNTER — Telehealth: Payer: Self-pay | Admitting: Cardiology

## 2017-12-07 MED ORDER — EVOLOCUMAB 140 MG/ML ~~LOC~~ SOAJ: 140.0000 mg | SUBCUTANEOUS | 11 refills | Status: DC

## 2017-12-07 NOTE — Telephone Encounter (Signed)
Spoke w/ pt and requested that he send a manual transmission b/c his home monitor has not updated in at least 14 days.   

## 2017-12-07 NOTE — Telephone Encounter (Addendum)
Pt approved for Repatha. Called patient and sent medication to [facility]. Advised patient to print co-pay card off [facility] website.

## 2017-12-08 ENCOUNTER — Ambulatory Visit (INDEPENDENT_AMBULATORY_CARE_PROVIDER_SITE_OTHER): Payer: 59 | Admitting: *Deleted

## 2017-12-08 DIAGNOSIS — I639 Cerebral infarction, unspecified: Secondary | ICD-10-CM | POA: Diagnosis not present

## 2017-12-11 NOTE — Progress Notes (Signed)
Carelink Summary Report / Loop Recorder 

## 2017-12-13 ENCOUNTER — Encounter: Payer: Self-pay | Admitting: Cardiology

## 2017-12-19 LAB — CUP PACEART REMOTE DEVICE CHECK
Implantable Pulse Generator Implant Date: 20180306
MDC IDC SESS DTM: 20190715003519

## 2018-01-10 ENCOUNTER — Ambulatory Visit (INDEPENDENT_AMBULATORY_CARE_PROVIDER_SITE_OTHER): Payer: 59 | Admitting: *Deleted

## 2018-01-10 DIAGNOSIS — I639 Cerebral infarction, unspecified: Secondary | ICD-10-CM | POA: Diagnosis not present

## 2018-01-11 NOTE — Progress Notes (Signed)
Carelink Summary Report / Loop Recorder 

## 2018-01-15 LAB — CUP PACEART REMOTE DEVICE CHECK
MDC IDC PG IMPLANT DT: 20180306
MDC IDC SESS DTM: 20190817003754

## 2018-01-18 ENCOUNTER — Telehealth: Payer: Self-pay | Admitting: Pharmacist

## 2018-01-18 MED ORDER — ICOSAPENT ETHYL 1 G PO CAPS: 2.0000 g | ORAL_CAPSULE | Freq: Two times a day (BID) | ORAL | 3 refills | Status: DC

## 2018-01-18 NOTE — Telephone Encounter (Addendum)
Pt LMOM report that he received approval letter for Vascepa in mail today. RX sent for Vascepa to pharmacy.   Spoke with patient he is tolerating Reptha well. He states the first injection hurt quite a bit, but the injections since then have been fine. He will be giving his 4th injection this weekend. He is schedule to have his annual physical on 10/9 with his primary care physician and will have lipids drawn then. He will have those faxed to our office. We did discuss to start the Vascepa as soon as he gets it and that lipid panel may not accurately show the influence of starting this medication.   He will keep follow up with Dr. [name] as scheduled.

## 2018-01-22 LAB — CUP PACEART REMOTE DEVICE CHECK
Implantable Pulse Generator Implant Date: 20180306
MDC IDC SESS DTM: 20190919013532

## 2018-02-12 ENCOUNTER — Ambulatory Visit (INDEPENDENT_AMBULATORY_CARE_PROVIDER_SITE_OTHER): Payer: 59 | Admitting: *Deleted

## 2018-02-12 DIAGNOSIS — I639 Cerebral infarction, unspecified: Secondary | ICD-10-CM | POA: Diagnosis not present

## 2018-02-12 NOTE — Telephone Encounter (Signed)
Lipid panel from 02/01/18 revealed TG down to 168 from 529 earlier this year.   LDL down to 29 from as high as 155 last year.   Will have him continue on Vascepa 2g BID and Repatha Q14 days. Follow up with Dr. [name] as scheduled.

## 2018-02-13 NOTE — Progress Notes (Signed)
Carelink Summary Report / Loop Recorder 

## 2018-02-15 NOTE — Progress Notes (Deleted)
Cardiology Office Note:    Date:  02/15/2018   ID:  Erik [name], DOB 23-Apr-1956, MRN [id number]  PCP:  Erik Ellis, NP  Cardiologist:  Erik Rouge, MD   Referring MD: Erik Ellis, NP   No chief complaint on file.   History of Present Illness:    Erik [name] is a 62 y.o. male with coronary artery disease status post CABG in 2009, prior stroke, chronic kidney disease status post right nephrectomy in 2007, hypertension.  Cardiac catheterization in December 2017 demonstrated 2 out of 4 grafts patent.  There was a diagonal (not grafted) with 90% ostial stenosis prior to the occlusion of the LAD which may have caused ischemia on the patient's nuclear stress test prior to his cardiac catheterization.  Medical therapy was recommended.  He is status post ILR in March 2018.  He is intolerant to statins.  Last seen by Erik [name] June 2019.  He called in recently with chest discomfort and elevated blood pressure.  Erik [name] suggested adding isosorbide and early follow-up.  Seen by PA 10/30/17 palpitations Imdur has helped with BP ILR showed no arrhythmia with palpitations only SR/ST   Lipids are much [name] on . Vascepa and repatha  ***  Prior CV studies:   The following studies were reviewed today:  Event monitor 05/27/2016 ([facility]) NSR, PVCs  Cardiac catheterization 04/15/2016 LAD mid 100; D1 ost 90 LCx mid 60 RCA dist 99 S-OM3 100 S-RPDA 100 L-LAD ok S-D3 ok Med Rx recommended Diagnostic Diagram    Echocardiogram 04/15/2016 Mod conc LVH, EF 45-50, mild ant-sept, ant and inf-sept HK, Gr 2 DD, mild MR, PASP 23  Nuclear stress test 04/14/2016 Large size, moderate severity revesrible anterior, anteroapical, anterolateral and anteroseptal wall defect suggestive of ischemia. LVEF 47% with global hypokinesis. This is an intermediate risk study.    Past Medical History:  Diagnosis Date  . Cancer [facility])    Renal cancer s/p Right nephrectomy  . Chest  pain 04/13/2016  . CKD (chronic kidney disease) stage 3, GFR 30-59 ml/min (HCC) 09/01/2015  . Coronary artery disease   . Essential hypertension with goal blood pressure less than 130/85 09/01/2015  . Hx of CABG   . Hypertension   . Renal disorder    Surgical Hx: The patient  has a past surgical history that includes Cardiac surgery; Nephrectomy (Right); Cardiac catheterization (N/A, 04/15/2016); and LOOP RECORDER INSERTION (N/A, 06/28/2016).   Current Medications: No outpatient medications have been marked as taking for the 02/19/18 encounter (Appointment) with Erik Hector, MD.     Allergies:   Rosuvastatin and Zetia [ezetimibe]   Social History   Tobacco Use  . Smoking status: Never Smoker  . Smokeless tobacco: Never Used  Substance Use Topics  . Alcohol use: Yes    Alcohol/week: 2.0 standard drinks    Types: 2 Shots of liquor per week  . Drug use: No     Family Hx: The patient's family history includes Heart attack in his father; Heart attack (age of onset: 91) in his brother.  ROS:   Please see the history of present illness.    Review of Systems  Cardiovascular: Positive for palpitations.   All other systems reviewed and are negative.   EKGs/Labs/Other Test Reviewed:    EKG:   10/30/17 Normal sinus rhythm, heart rate 79, normal axis, QTC 431, similar to tracing dated 09/26/2017  Recent Labs: 10/30/2017: BUN 21; Creatinine, Ser 2.05; Potassium 4.9; Sodium 140 12/04/2017: ALT 26  Recent Lipid Panel Lab Results  Component Value Date/Time   CHOL 212 (H) 12/04/2017 10:01 AM   TRIG 341 (H) 12/04/2017 10:01 AM   HDL 35 (L) 12/04/2017 10:01 AM   CHOLHDL 6.1 (H) 12/04/2017 10:01 AM   CHOLHDL 5.4 04/15/2016 03:39 AM   LDLCALC 109 (H) 12/04/2017 10:01 AM   LDLDIRECT 124 (H) 12/04/2017 10:01 AM    Physical Exam:    VS:  There were no vitals taken for this visit.    Wt Readings from Last 3 Encounters:  10/30/17 282 lb 1.9 oz (128 kg)  09/26/17 286 lb 8 oz (130 kg)    06/28/16 278 lb (126.1 kg)    Affect appropriate Obese male  HEENT: normal Neck supple with no adenopathy JVP normal no bruits no thyromegaly Lungs clear with no wheezing and good diaphragmatic motion Heart:  S1/S2 no murmur, no rub, gallop or click PMI normal Abdomen: benighn, BS positve, no tenderness, no AAA no bruit.  No HSM or HJR Distal pulses intact with no bruits No edema Neuro non-focal Skin warm and dry No muscular weakness   ASSESSMENT & PLAN:    Coronary artery disease with angina pectoris [facility])  CABG 2009 Cath 03/2016 medica Rx 2/4 grafts patent small ungrafted diagonal Branch thought best to Rx medically Improved with nitrates.   Essential hypertension  Well controlled.  Continue current medications and low sodium Dash type diet.    CKD (chronic kidney disease) stage 3, GFR 30-59 ml/min (HCC)  Baseline Cr around 2 last checked 10/30/17 2.05 mg/dl  Hyperlipidemia, unspecified hyperlipidemia type Improved with repatha and vascepa f/u lipid clinic   History of stroke ILR with no PAF don't see recent carotids Given extent of CAD will check duplex   PVC's (premature ventricular contractions) Currently, his symptoms are controlled continue beta blocker No significant arrhythmia or PAF on interrogation of ILR   F/U in a year  [employer]

## 2018-02-19 ENCOUNTER — Ambulatory Visit: Payer: 59 | Admitting: Cardiovascular Disease

## 2018-03-02 LAB — CUP PACEART REMOTE DEVICE CHECK
Date Time Interrogation Session: 20191022020935
Implantable Pulse Generator Implant Date: 20180306

## 2018-03-15 ENCOUNTER — Telehealth: Payer: Self-pay

## 2018-03-15 DIAGNOSIS — E785 Hyperlipidemia, unspecified: Secondary | ICD-10-CM

## 2018-03-15 NOTE — Telephone Encounter (Signed)
Pt stated that they had blood drawn from pcp 4 weeks in and really impressed with repatha. I advised pt that an 8 week post initial dose is desired for optimal results. Pt stated that they already had an appt scheduled with their cardiologist on December 20 and that they would comply to get labs done on the same day

## 2018-03-19 ENCOUNTER — Ambulatory Visit (INDEPENDENT_AMBULATORY_CARE_PROVIDER_SITE_OTHER): Payer: 59

## 2018-03-19 DIAGNOSIS — I639 Cerebral infarction, unspecified: Secondary | ICD-10-CM | POA: Diagnosis not present

## 2018-03-19 DIAGNOSIS — R002 Palpitations: Secondary | ICD-10-CM

## 2018-03-19 NOTE — Progress Notes (Signed)
Carelink Summary Report / Loop Recorder 

## 2018-04-11 NOTE — Progress Notes (Signed)
Cardiology Office Note:    Date:  04/13/2018   ID:  Erik [name], DOB Feb 21, 1956, MRN [id number]  PCP:  Erik Ellis, NP  Cardiologist:  Jenkins Rouge, MD   Referring MD: Erik Ellis, NP   No chief complaint on file.   History of Present Illness:    Erik [name] is a 62 y.o. male with coronary artery disease status post CABG in 2009, prior stroke, chronic kidney disease status post right nephrectomy in 2007, hypertension.  Cardiac catheterization in December 2017 demonstrated 2 out of 4 grafts patent.  There was a diagonal (not grafted) with 90% ostial stenosis prior to the occlusion of the LAD which may have caused ischemia on the patient's nuclear stress test prior to his cardiac catheterization.  Medical therapy was recommended.  He is status post ILR in March 2018.  He is intolerant to statins.  No angina with addition of nitrates. Lipids much [name] on Vascepa and Repatha Some palpitations with ILR showing NSR rare PVCls and no PaF Event monitor 05/27/16 just PVC;s  Last TTE 04/15/16 EF 45-50% needs to be updated   Wanted to come off fenofibrate Needs labs today including BMET for his CRF Daughter training horses in [place]. Son in [place] soon doing maintenance at Assisted living     Prior CV studies:   The following studies were reviewed today:  Event monitor 05/27/2016 ([facility]) NSR, PVCs  Cardiac catheterization 04/15/2016 LAD mid 100; D1 ost 90 LCx mid 60 RCA dist 99 S-OM3 100 S-RPDA 100 L-LAD ok S-D3 ok Med Rx recommended Diagnostic Diagram    Echocardiogram 04/15/2016 Mod conc LVH, EF 45-50, mild ant-sept, ant and inf-sept HK, Gr 2 DD, mild MR, PASP 23  Nuclear stress test 04/14/2016 Large size, moderate severity revesrible anterior, anteroapical, anterolateral and anteroseptal wall defect suggestive of ischemia. LVEF 47% with global hypokinesis. This is an intermediate risk study.    Past Medical History:  Diagnosis Date  .  Cancer [facility])    Renal cancer s/p Right nephrectomy  . Chest pain 04/13/2016  . CKD (chronic kidney disease) stage 3, GFR 30-59 ml/min (HCC) 09/01/2015  . Coronary artery disease   . Essential hypertension with goal blood pressure less than 130/85 09/01/2015  . Hx of CABG   . Hypertension   . Renal disorder    Surgical Hx: The patient  has a past surgical history that includes Cardiac surgery; Nephrectomy (Right); Cardiac catheterization (N/A, 04/15/2016); and LOOP RECORDER INSERTION (N/A, 06/28/2016).   Current Medications: Current Meds  Medication Sig  . acetaminophen (TYLENOL) 500 MG tablet Take 500-1,000 mg by mouth every 6 (six) hours as needed (for arthritic pain).  Marland Kitchen amLODipine (NORVASC) 10 MG tablet Take 10 mg by mouth as directed.  Marland Kitchen aspirin EC 81 MG tablet Take 1 tablet (81 mg total) by mouth daily.  . Cholecalciferol (VITAMIN D3) 2000 units capsule Take 2,000 Units by mouth daily.  . clopidogrel (PLAVIX) 75 MG tablet Take 75 mg by mouth daily.  . Evolocumab (REPATHA SURECLICK) 628 MG/ML SOAJ Inject 140 mg into the skin every 14 (fourteen) days.  . furosemide (LASIX) 20 MG tablet Take 20 mg by mouth daily.  Vanessa Kick Ethyl (VASCEPA) 1 g CAPS Take 2 capsules (2 g total) by mouth 2 (two) times daily.  . isosorbide mononitrate (IMDUR) 30 MG 24 hr tablet Take 1 tablet (30 mg total) by mouth daily.  Marland Kitchen lisinopril (PRINIVIL,ZESTRIL) 5 MG tablet Take 1 tablet (5 mg total) by mouth  daily.  . magnesium oxide (MAG-OX) 400 MG tablet Take 400 mg by mouth 2 (two) times daily.  . metoprolol succinate (TOPROL-XL) 100 MG 24 hr tablet Take 100 mg by mouth daily.  . nitroGLYCERIN (NITROSTAT) 0.4 MG SL tablet Place 1 tablet (0.4 mg total) under the tongue every 5 (five) minutes as needed for chest pain.  . Soft Lens Products (B & L SENSITIVE EYES SALINE) 0.4 % SOLN Place 1-2 drops into both eyes daily as needed (for contact lenses).  . TURMERIC PO Take by mouth as directed.  . vitamin B-12  (CYANOCOBALAMIN) 250 MCG tablet Take 250 mcg by mouth daily.   . [DISCONTINUED] fenofibrate 54 MG tablet Take 1 tablet (54 mg total) by mouth daily.     Allergies:   Rosuvastatin and Zetia [ezetimibe]   Social History   Tobacco Use  . Smoking status: Never Smoker  . Smokeless tobacco: Never Used  Substance Use Topics  . Alcohol use: Yes    Alcohol/week: 2.0 standard drinks    Types: 2 Shots of liquor per week  . Drug use: No     Family Hx: The patient's family history includes Heart attack in his father; Heart attack (age of onset: 23) in his brother.  ROS:   Please see the history of present illness.    Review of Systems  Cardiovascular: Positive for palpitations.   All other systems reviewed and are negative.   EKGs/Labs/Other Test Reviewed:    EKG:  EKG is  ordered today.  The ekg ordered today demonstrates normal sinus rhythm, heart rate 79, normal axis, QTC 431, similar to tracing dated 09/26/2017  Recent Labs: 10/30/2017: BUN 21; Creatinine, Ser 2.05; Potassium 4.9; Sodium 140 12/04/2017: ALT 26   Recent Lipid Panel Lab Results  Component Value Date/Time   CHOL 212 (H) 12/04/2017 10:01 AM   TRIG 341 (H) 12/04/2017 10:01 AM   HDL 35 (L) 12/04/2017 10:01 AM   CHOLHDL 6.1 (H) 12/04/2017 10:01 AM   CHOLHDL 5.4 04/15/2016 03:39 AM   LDLCALC 109 (H) 12/04/2017 10:01 AM   LDLDIRECT 124 (H) 12/04/2017 10:01 AM    Physical Exam:    VS:  BP 138/82   Pulse 83   Ht 5\' 11"  (1.803 m)   Wt 284 lb (128.8 kg)   SpO2 97%   BMI 39.61 kg/m     Wt Readings from Last 3 Encounters:  04/13/18 284 lb (128.8 kg)  10/30/17 282 lb 1.9 oz (128 kg)  09/26/17 286 lb 8 oz (130 kg)    Affect appropriate Healthy:  appears stated age HEENT: normal Neck supple with no adenopathy JVP normal no bruits no thyromegaly Lungs clear with no wheezing and good diaphragmatic motion Heart:  S1/S2 no murmur, no rub, gallop or click PMI normal Abdomen: benighn, BS positve, no tenderness, no  AAA no bruit.  No HSM or HJR Distal pulses intact with no bruits No edema Neuro non-focal Skin warm and dry No muscular weakness    ASSESSMENT & PLAN:    Coronary artery disease with angina pectoris ([place]) - CABG 2009. Cath 2017 with 2/4 grafts patent small diagonal not grafted recommended medical Rx no angina with addition of long acting nitrates.    Essential hypertension - Well controlled.  Continue current medications and low sodium Dash type diet.     CKD (chronic kidney disease) stage 3, GFR 30-59 ml/min (HCC) - Cr stable 2.05 10/30/17 f/u nephrology   History of stroke- ILR with no PAF Continue  plavix   HLD:  Much improved on Vascepa and Repatha f/u lipid clinic labs today LDL 29  with Triglycerides lower as well 169 Had been over 500 d/c fenofibrate repeat labs 3 months   PVC's (premature ventricular contractions)  - asymptomatic continue beta blocker Update echo to reassess EF which has been 45-50% 2017    Ordered TTE lipid and liver profile   F/U in a year   Signed, Jenkins Rouge, MD  04/13/2018 11:04 AM    [address], [place], [place]  [address] Phone: [phone]; Fax: [phone]

## 2018-04-13 ENCOUNTER — Encounter: Payer: Self-pay | Admitting: Cardiovascular Disease

## 2018-04-13 ENCOUNTER — Other Ambulatory Visit: Payer: 59

## 2018-04-13 ENCOUNTER — Ambulatory Visit: Payer: 59 | Admitting: Cardiovascular Disease

## 2018-04-13 VITALS — BP 138/82 | HR 83 | Ht 71.0 in | Wt 284.0 lb

## 2018-04-13 DIAGNOSIS — E785 Hyperlipidemia, unspecified: Secondary | ICD-10-CM

## 2018-04-13 DIAGNOSIS — N183 Chronic kidney disease, stage 3 unspecified: Secondary | ICD-10-CM

## 2018-04-13 DIAGNOSIS — Z8673 Personal history of transient ischemic attack (TIA), and cerebral infarction without residual deficits: Secondary | ICD-10-CM

## 2018-04-13 DIAGNOSIS — I10 Essential (primary) hypertension: Secondary | ICD-10-CM

## 2018-04-13 DIAGNOSIS — I25119 Atherosclerotic heart disease of native coronary artery with unspecified angina pectoris: Secondary | ICD-10-CM | POA: Diagnosis not present

## 2018-04-13 DIAGNOSIS — I493 Ventricular premature depolarization: Secondary | ICD-10-CM

## 2018-04-13 LAB — COMPREHENSIVE METABOLIC PANEL
ALT: 32 IU/L (ref 0–44)
AST: 22 IU/L (ref 0–40)
Albumin/Globulin Ratio: 2.2 (ref 1.2–2.2)
Albumin: 4.3 g/dL (ref 3.6–4.8)
Alkaline Phosphatase: 52 IU/L (ref 39–117)
BILIRUBIN TOTAL: 0.3 mg/dL (ref 0.0–1.2)
BUN / CREAT RATIO: 13 (ref 10–24)
BUN: 27 mg/dL (ref 8–27)
CHLORIDE: 105 mmol/L (ref 96–106)
CO2: 23 mmol/L (ref 20–29)
Calcium: 9.6 mg/dL (ref 8.6–10.2)
Creatinine, Ser: 2.06 mg/dL — ABNORMAL HIGH (ref 0.76–1.27)
GFR calc non Af Amer: 33 mL/min/{1.73_m2} — ABNORMAL LOW (ref >59)
GFR, EST AFRICAN AMERICAN: 38 mL/min/{1.73_m2} — AB (ref >59)
Globulin, Total: 2 g/dL (ref 1.5–4.5)
Glucose: 103 mg/dL — ABNORMAL HIGH (ref 65–99)
Potassium: 4.6 mmol/L (ref 3.5–5.2)
Sodium: 139 mmol/L (ref 134–144)
Total Protein: 6.3 g/dL (ref 6.0–8.5)

## 2018-04-13 LAB — LIPID PANEL
CHOL/HDL RATIO: 2.7 ratio (ref 0.0–5.0)
Cholesterol, Total: 117 mg/dL (ref 100–199)
HDL: 43 mg/dL (ref >39)
LDL Calculated: 37 mg/dL (ref 0–99)
Triglycerides: 186 mg/dL — ABNORMAL HIGH (ref 0–149)
VLDL Cholesterol Cal: 37 mg/dL (ref 5–40)

## 2018-04-13 NOTE — Patient Instructions (Addendum)
Medication Instructions:  Your physician has recommended you make the following change in your medication:  1- STOP Fenofibrate  If you need a refill on your cardiac medications before your next appointment, please call your pharmacy.   Lab work: Your physician recommends that you have lab work today- CMET and Lipid panel  Your physician recommends that you return for lab work in: 3 months for lipid and liver panel.  If you have labs (blood work) drawn today and your tests are completely normal, you will receive your results only by: Marland Kitchen MyChart Message (if you have MyChart) OR . A paper copy in the mail If you have any lab test that is abnormal or we need to change your treatment, we will call you to review the results.  Testing/Procedures: NONE ordered at this time.  Follow-Up: At [facility], you and your health needs are our priority.  As part of our continuing mission to provide you with exceptional heart care, we have created designated Provider Care Teams.  These Care Teams include your primary Cardiologist (physician) and Advanced Practice Providers (APPs -  Physician Assistants and Nurse Practitioners) who all work together to provide you with the care you need, when you need it. You will need a follow up appointment in 6 months.  Please call our office 2 months in advance to schedule this appointment.  You may see Jenkins Rouge, MD or one of the following Advanced Practice Providers on your designated Care Team:   Truitt Merle, NP Cecilie Kicks, NP . Kathyrn Drown, NP

## 2018-04-19 ENCOUNTER — Ambulatory Visit: Payer: 59

## 2018-04-20 LAB — CUP PACEART REMOTE DEVICE CHECK
Implantable Pulse Generator Implant Date: 20180306
MDC IDC SESS DTM: 20191227031016

## 2018-05-06 LAB — CUP PACEART REMOTE DEVICE CHECK
Date Time Interrogation Session: 20191124024138
Implantable Pulse Generator Implant Date: 20180306

## 2018-05-14 ENCOUNTER — Telehealth: Payer: Self-pay

## 2018-05-14 NOTE — Telephone Encounter (Signed)
I spoke with pt about sending a manual transmission. Pt agreed to send one when he get home.

## 2018-05-15 NOTE — Telephone Encounter (Signed)
Manual transmission received and reviewed. ECG shows ST with PVCs.

## 2018-05-22 ENCOUNTER — Ambulatory Visit (INDEPENDENT_AMBULATORY_CARE_PROVIDER_SITE_OTHER): Payer: 59

## 2018-05-22 DIAGNOSIS — I639 Cerebral infarction, unspecified: Secondary | ICD-10-CM

## 2018-05-22 DIAGNOSIS — R002 Palpitations: Secondary | ICD-10-CM

## 2018-05-23 NOTE — Progress Notes (Signed)
Carelink Summary Report / Loop Recorder 

## 2018-05-24 LAB — CUP PACEART REMOTE DEVICE CHECK
Implantable Pulse Generator Implant Date: 20180306
MDC IDC SESS DTM: 20200129030545

## 2018-06-01 ENCOUNTER — Other Ambulatory Visit: Payer: Self-pay | Admitting: Pharmacist

## 2018-06-01 MED ORDER — EVOLOCUMAB 140 MG/ML ~~LOC~~ SOAJ: 140.0000 mg | SUBCUTANEOUS | 11 refills | Status: DC

## 2018-06-25 ENCOUNTER — Ambulatory Visit (INDEPENDENT_AMBULATORY_CARE_PROVIDER_SITE_OTHER): Payer: 59 | Admitting: *Deleted

## 2018-06-25 DIAGNOSIS — R002 Palpitations: Secondary | ICD-10-CM

## 2018-06-25 DIAGNOSIS — I639 Cerebral infarction, unspecified: Secondary | ICD-10-CM

## 2018-06-25 LAB — CUP PACEART REMOTE DEVICE CHECK
Date Time Interrogation Session: 20200302071239
Implantable Pulse Generator Implant Date: 20180306

## 2018-07-02 NOTE — Progress Notes (Signed)
Carelink Summary Report / Loop Recorder 

## 2018-07-09 ENCOUNTER — Other Ambulatory Visit: Payer: Self-pay | Admitting: Cardiovascular Disease

## 2018-07-09 ENCOUNTER — Other Ambulatory Visit: Payer: Self-pay

## 2018-07-09 ENCOUNTER — Other Ambulatory Visit: Payer: 59 | Admitting: *Deleted

## 2018-07-09 LAB — HEPATIC FUNCTION PANEL
ALT: 23 IU/L (ref 0–44)
AST: 20 IU/L (ref 0–40)
Albumin: 4.3 g/dL (ref 3.8–4.8)
Alkaline Phosphatase: 56 IU/L (ref 39–117)
Bilirubin Total: 0.4 mg/dL (ref 0.0–1.2)
Bilirubin, Direct: 0.17 mg/dL (ref 0.00–0.40)
Total Protein: 6.4 g/dL (ref 6.0–8.5)

## 2018-07-09 LAB — LIPID PANEL
Chol/HDL Ratio: 3 ratio (ref 0.0–5.0)
Cholesterol, Total: 119 mg/dL (ref 100–199)
HDL: 40 mg/dL (ref >39)
LDL CALC: 47 mg/dL (ref 0–99)
Triglycerides: 162 mg/dL — ABNORMAL HIGH (ref 0–149)
VLDL Cholesterol Cal: 32 mg/dL (ref 5–40)

## 2018-07-10 ENCOUNTER — Telehealth: Payer: Self-pay

## 2018-07-10 DIAGNOSIS — E785 Hyperlipidemia, unspecified: Secondary | ICD-10-CM

## 2018-07-10 NOTE — Telephone Encounter (Signed)
-----   Message from [name], MD sent at 07/10/2018  9:38 AM EDT ----- Cholesterol is at goal and LFT's are normal F/U labs in 6 months

## 2018-07-10 NOTE — Telephone Encounter (Signed)
Sent results through [place]. Requested patient to give our office a call to schedule lab work.

## 2018-07-30 ENCOUNTER — Ambulatory Visit (INDEPENDENT_AMBULATORY_CARE_PROVIDER_SITE_OTHER): Payer: 59 | Admitting: *Deleted

## 2018-07-30 ENCOUNTER — Other Ambulatory Visit: Payer: Self-pay

## 2018-07-30 DIAGNOSIS — I639 Cerebral infarction, unspecified: Secondary | ICD-10-CM

## 2018-07-30 LAB — CUP PACEART REMOTE DEVICE CHECK
Date Time Interrogation Session: 20200404094255
Implantable Pulse Generator Implant Date: 20180306

## 2018-08-07 NOTE — Progress Notes (Signed)
Carelink Summary Report / Loop Recorder 

## 2018-08-30 ENCOUNTER — Other Ambulatory Visit: Payer: Self-pay

## 2018-08-30 ENCOUNTER — Ambulatory Visit (INDEPENDENT_AMBULATORY_CARE_PROVIDER_SITE_OTHER): Payer: No Typology Code available for payment source | Admitting: *Deleted

## 2018-08-30 DIAGNOSIS — I639 Cerebral infarction, unspecified: Secondary | ICD-10-CM

## 2018-08-30 LAB — CUP PACEART REMOTE DEVICE CHECK
Date Time Interrogation Session: 20200507100524
Implantable Pulse Generator Implant Date: 20180306

## 2018-09-04 NOTE — Progress Notes (Signed)
Carelink Summary Report / Loop Recorder 

## 2018-09-18 ENCOUNTER — Telehealth: Payer: Self-pay

## 2018-09-18 NOTE — Telephone Encounter (Signed)
I have done a Vascepa PA through covermymeds. Key: [id number]

## 2018-09-19 NOTE — Telephone Encounter (Signed)
**Note De-Identified Sophina Mitten Obfuscation** Message from covermymeds: [name] (KeyAmedeo Kinsman)  Rx #: [id number]  Vascepa 1GM capsules  Form [employer] Electronic Prior Authorization Form (2017 NCPDP)  Created Determination  Unfavorable  21 hours ago  Message from Plan Request Reference Number: [id number]. VASCEPA CAP 1GM is denied for not meeting the prior authorization requirement(s). For further questions, call [phone]. Appeals are not supported through [place]. Please refer to the fax case notice for appeals information and instructions.    I called [employer] at the number listed above and did another PA over the phone with Olivia Mackie. Per Olivia Mackie this Vascepa PA has been approved and that they will be faxing approval details to [place] within 24 hours.

## 2018-10-01 ENCOUNTER — Telehealth: Payer: Self-pay | Admitting: Cardiovascular Disease

## 2018-10-01 NOTE — Telephone Encounter (Signed)
Please see phone note from 09/18/2018. I have gave Jamia at covermymeds all of the info needed for this request which has been approved by [facility].

## 2018-10-01 NOTE — Telephone Encounter (Signed)
Letter received from [facility] stating that they have approved the pts Vascepa PA. Approval good from 09/19/2018-09/19/2019. [id number].  I have notified the pts pharmacy.

## 2018-10-01 NOTE — Telephone Encounter (Signed)
Erik [name] from [employer] is calling in regards to authorization for [place]  Ref # [id number]

## 2018-10-02 ENCOUNTER — Ambulatory Visit (INDEPENDENT_AMBULATORY_CARE_PROVIDER_SITE_OTHER): Payer: No Typology Code available for payment source | Admitting: *Deleted

## 2018-10-02 DIAGNOSIS — I639 Cerebral infarction, unspecified: Secondary | ICD-10-CM | POA: Diagnosis not present

## 2018-10-02 DIAGNOSIS — R002 Palpitations: Secondary | ICD-10-CM

## 2018-10-02 LAB — CUP PACEART REMOTE DEVICE CHECK
Date Time Interrogation Session: 20200609120741
Implantable Pulse Generator Implant Date: 20180306

## 2018-10-10 ENCOUNTER — Other Ambulatory Visit: Payer: Self-pay

## 2018-10-10 DIAGNOSIS — I10 Essential (primary) hypertension: Secondary | ICD-10-CM

## 2018-10-10 MED ORDER — ISOSORBIDE MONONITRATE ER 30 MG PO TB24: 30.0000 mg | ORAL_TABLET | Freq: Every day | ORAL | 1 refills | Status: AC

## 2018-10-10 MED ORDER — REPATHA SURECLICK 140 MG/ML ~~LOC~~ SOAJ: 140.0000 mg | SUBCUTANEOUS | 11 refills | Status: DC

## 2018-10-11 NOTE — Progress Notes (Signed)
Carelink Summary Report / Loop Recorder 

## 2018-10-15 ENCOUNTER — Ambulatory Visit: Payer: 59 | Admitting: Cardiovascular Disease

## 2018-10-16 ENCOUNTER — Telehealth: Payer: Self-pay | Admitting: Cardiovascular Disease

## 2018-10-16 NOTE — Telephone Encounter (Signed)
Patient called stating he is having trouble getting his medication as it needs a prior-auth.  The medication is Evolocumab (REPATHA SURECLICK) 811 MG/ML SOAJ

## 2018-10-17 NOTE — Telephone Encounter (Signed)
Called the pt to discuss insurance for the repatha pa and he stated that it had changed pt gave me the new insurance information to do the pa and requested samples that he will pick up from the chst location later on today

## 2018-10-17 NOTE — Telephone Encounter (Signed)
ID [id number] Bin [id number]  pcn [id number]  grp [id number]  Will route to [facility] to make aware pt is coming by to pick up samples and I will also do the pa for the repatha. We agreed to give the pt 2 samples.

## 2018-10-17 NOTE — Telephone Encounter (Signed)
Haleigh would you be able to help with a prior authorization renewal for Repatha?

## 2018-10-19 ENCOUNTER — Telehealth: Payer: Self-pay

## 2018-10-19 NOTE — Telephone Encounter (Signed)
Spoke with patient to remind of missed remote transmission 

## 2018-11-05 ENCOUNTER — Ambulatory Visit (INDEPENDENT_AMBULATORY_CARE_PROVIDER_SITE_OTHER): Payer: No Typology Code available for payment source | Admitting: *Deleted

## 2018-11-05 DIAGNOSIS — I25119 Atherosclerotic heart disease of native coronary artery with unspecified angina pectoris: Secondary | ICD-10-CM

## 2018-11-05 LAB — CUP PACEART REMOTE DEVICE CHECK
Date Time Interrogation Session: 20200712123815
Implantable Pulse Generator Implant Date: 20180306

## 2018-11-06 NOTE — Telephone Encounter (Signed)
PA sent under new insurance information. Awaiting response.

## 2018-11-15 NOTE — Progress Notes (Signed)
Carelink Summary Report / Loop Recorder 

## 2018-11-22 ENCOUNTER — Telehealth: Payer: Self-pay

## 2018-11-22 NOTE — Telephone Encounter (Signed)
Spoke with patient to advise of disconnected monitor. 

## 2018-12-07 ENCOUNTER — Ambulatory Visit (INDEPENDENT_AMBULATORY_CARE_PROVIDER_SITE_OTHER): Payer: No Typology Code available for payment source | Admitting: *Deleted

## 2018-12-07 DIAGNOSIS — I639 Cerebral infarction, unspecified: Secondary | ICD-10-CM | POA: Diagnosis not present

## 2018-12-07 LAB — CUP PACEART REMOTE DEVICE CHECK
Date Time Interrogation Session: 20200814114255
Implantable Pulse Generator Implant Date: 20180306

## 2018-12-17 NOTE — Progress Notes (Signed)
Carelink Summary Report / Loop Recorder 

## 2018-12-31 NOTE — Progress Notes (Signed)
Cardiology Office Note:    Date:  12/31/2018   ID:  Erik [name], DOB 03/07/1956, MRN [id number]  PCP:  Erik Ellis, NP  Cardiologist:  Erik Rouge, MD   Referring MD: Erik Ellis, NP   No chief complaint on file.   History of Present Illness:    Erik [name] is a 63 y.o. male with coronary artery disease status post CABG in 2009, prior stroke, chronic kidney disease status post right nephrectomy in 2007, hypertension.  Cardiac catheterization in December 2017 demonstrated 2 out of 4 grafts patent.  There was a diagonal (not grafted) with 90% ostial stenosis prior to the occlusion of the LAD which may have caused ischemia on the patient's nuclear stress test prior to his cardiac catheterization.  Medical therapy was recommended.  He is status post ILR in March 2018.  He is intolerant to statins.  No angina with addition of nitrates. Lipids much [name] on Vascepa and Repatha Some palpitations with ILR showing NSR rare PVCls and no PaF Event monitor 05/27/16 just PVC;s  Last TTE 04/15/16 EF 45-50% needs to be updated   Wanted to come off fenofibrate Needs labs today including BMET for his CRF Daughter training horses in [place]. Son in [place] soon doing maintenance at Assisted living     Prior CV studies:   The following studies were reviewed today:  Event monitor 05/27/2016 ([facility]) NSR, PVCs  Cardiac catheterization 04/15/2016 LAD mid 100; D1 ost 90 LCx mid 60 RCA dist 99 S-OM3 100 S-RPDA 100 L-LAD ok S-D3 ok Med Rx recommended Diagnostic Diagram    Echocardiogram 04/15/2016 Mod conc LVH, EF 45-50, mild ant-sept, ant and inf-sept HK, Gr 2 DD, mild MR, PASP 23  Nuclear stress test 04/14/2016 Large size, moderate severity revesrible anterior, anteroapical, anterolateral and anteroseptal wall defect suggestive of ischemia. LVEF 47% with global hypokinesis. This is an intermediate risk study.    Past Medical History:  Diagnosis Date  .  Cancer [facility])    Renal cancer s/p Right nephrectomy  . Chest pain 04/13/2016  . CKD (chronic kidney disease) stage 3, GFR 30-59 ml/min (HCC) 09/01/2015  . Coronary artery disease   . Essential hypertension with goal blood pressure less than 130/85 09/01/2015  . Hx of CABG   . Hypertension   . Renal disorder    Surgical Hx: The patient  has a past surgical history that includes Cardiac surgery; Nephrectomy (Right); Cardiac catheterization (N/A, 04/15/2016); and LOOP RECORDER INSERTION (N/A, 06/28/2016).   Current Medications: No outpatient medications have been marked as taking for the 01/10/19 encounter (Appointment) with [name], MD.     Allergies:   Rosuvastatin and Zetia [ezetimibe]   Social History   Tobacco Use  . Smoking status: Never Smoker  . Smokeless tobacco: Never Used  Substance Use Topics  . Alcohol use: Yes    Alcohol/week: 2.0 standard drinks    Types: 2 Shots of liquor per week  . Drug use: No     Family Hx: The patient's family history includes Heart attack in his father; Heart attack (age of onset: 53) in his brother.  ROS:   Please see the history of present illness.    Review of Systems  Cardiovascular: Positive for palpitations.   All other systems reviewed and are negative.   EKGs/Labs/Other Test Reviewed:    EKG:  9/17/ SR rate 73 isolated PVC otherwise normal   Recent Labs: 04/13/2018: BUN 27; Creatinine, Ser 2.06; Potassium 4.6; Sodium 139  07/09/2018: ALT 23   Recent Lipid Panel Lab Results  Component Value Date/Time   CHOL 119 07/09/2018 09:00 AM   TRIG 162 (H) 07/09/2018 09:00 AM   HDL 40 07/09/2018 09:00 AM   CHOLHDL 3.0 07/09/2018 09:00 AM   CHOLHDL 5.4 04/15/2016 03:39 AM   LDLCALC 47 07/09/2018 09:00 AM   LDLDIRECT 124 (H) 12/04/2017 10:01 AM    Physical Exam:    VS:  There were no vitals taken for this visit.    Wt Readings from Last 3 Encounters:  04/13/18 284 lb (128.8 kg)  10/30/17 282 lb 1.9 oz (128 kg)  09/26/17  286 lb 8 oz (130 kg)    Affect appropriate Healthy:  appears stated age 64: normal Neck supple with no adenopathy JVP normal no bruits no thyromegaly Lungs clear with no wheezing and good diaphragmatic motion Heart:  S1/S2 no murmur, no rub, gallop or click PMI normal Abdomen: benighn, BS positve, no tenderness, no AAA no bruit.  No HSM or HJR Distal pulses intact with no bruits No edema Neuro non-focal Skin warm and dry No muscular weakness    ASSESSMENT & PLAN:    Coronary artery disease with angina pectoris ([place]) - CABG 2009. Cath 2017 patent lima to LAD, patent SVG to D2 Occluded SVG to RCA with Left to right collaterals to distal RCA  Occluded SVG to OM2 with 50% native mid circumflex disease. No angina stable    Essential hypertension - Well controlled.  Continue current medications and low sodium Dash type diet.     CKD (chronic kidney disease) stage 3, GFR 38 Cr 2.06 04/13/18  History of stroke- ILR with no PAF Continue plavix   HLD:  Much improved on Vascepa and Repatha f/u lipid clinic LDL 47 Triglycerides 162 improved 07/09/18 with normal LfTls  PVC's (premature ventricular contractions)  - asymptomatic continue beta blocker Update echo to reassess EF which has been 45-50% 2017    Ordered TTE    F/U in a year   Signed, Erik Rouge, MD  12/31/2018 7:19 PM    [facility] [place], [place],   [address] Phone: [phone]; Fax: [phone]

## 2019-01-09 ENCOUNTER — Ambulatory Visit (INDEPENDENT_AMBULATORY_CARE_PROVIDER_SITE_OTHER): Payer: No Typology Code available for payment source | Admitting: *Deleted

## 2019-01-09 DIAGNOSIS — I639 Cerebral infarction, unspecified: Secondary | ICD-10-CM

## 2019-01-09 LAB — CUP PACEART REMOTE DEVICE CHECK
Date Time Interrogation Session: 20200916124254
Implantable Pulse Generator Implant Date: 20180306

## 2019-01-10 ENCOUNTER — Other Ambulatory Visit: Payer: Self-pay

## 2019-01-10 ENCOUNTER — Encounter: Payer: Self-pay | Admitting: Cardiovascular Disease

## 2019-01-10 ENCOUNTER — Ambulatory Visit (INDEPENDENT_AMBULATORY_CARE_PROVIDER_SITE_OTHER): Payer: No Typology Code available for payment source | Admitting: Cardiovascular Disease

## 2019-01-10 VITALS — BP 100/82 | HR 73 | Ht 71.0 in | Wt 277.0 lb

## 2019-01-10 DIAGNOSIS — I493 Ventricular premature depolarization: Secondary | ICD-10-CM

## 2019-01-10 DIAGNOSIS — E785 Hyperlipidemia, unspecified: Secondary | ICD-10-CM | POA: Diagnosis not present

## 2019-01-10 LAB — LIPID PANEL
Chol/HDL Ratio: 2.6 ratio (ref 0.0–5.0)
Cholesterol, Total: 125 mg/dL (ref 100–199)
HDL: 48 mg/dL (ref >39)
LDL Chol Calc (NIH): 52 mg/dL (ref 0–99)
Triglycerides: 144 mg/dL (ref 0–149)
VLDL Cholesterol Cal: 25 mg/dL (ref 5–40)

## 2019-01-10 LAB — HEPATIC FUNCTION PANEL
ALT: 26 IU/L (ref 0–44)
AST: 21 IU/L (ref 0–40)
Albumin: 4.4 g/dL (ref 3.8–4.8)
Alkaline Phosphatase: 51 IU/L (ref 39–117)
Bilirubin Total: 0.6 mg/dL (ref 0.0–1.2)
Bilirubin, Direct: 0.2 mg/dL (ref 0.00–0.40)
Total Protein: 6.3 g/dL (ref 6.0–8.5)

## 2019-01-10 MED ORDER — NITROGLYCERIN 0.4 MG SL SUBL: 0.4000 mg | SUBLINGUAL_TABLET | SUBLINGUAL | 3 refills | Status: AC | PRN

## 2019-01-10 NOTE — Patient Instructions (Signed)
Medication Instructions:   If you need a refill on your cardiac medications before your next appointment, please call your pharmacy.   Lab work: Your physician recommends that you have lab work today. Lipid and liver panel.  If you have labs (blood work) drawn today and your tests are completely normal, you will receive your results only by: Marland Kitchen MyChart Message (if you have MyChart) OR . A paper copy in the mail If you have any lab test that is abnormal or we need to change your treatment, we will call you to review the results.  Testing/Procedures: Your physician has requested that you have an echocardiogram. Echocardiography is a painless test that uses sound waves to create images of your heart. It provides your doctor with information about the size and shape of your heart and how well your heart's chambers and valves are working. This procedure takes approximately one hour. There are no restrictions for this procedure.  Follow-Up: At [facility], you and your health needs are our priority.  As part of our continuing mission to provide you with exceptional heart care, we have created designated Provider Care Teams.  These Care Teams include your primary Cardiologist (physician) and Advanced Practice Providers (APPs -  Physician Assistants and Nurse Practitioners) who all work together to provide you with the care you need, when you need it. You will need a follow up appointment in 6 months.  Please call our office 2 months in advance to schedule this appointment.  You may see Jenkins Rouge, MD or one of the following Advanced Practice Providers on your designated Care Team:   Truitt Merle, NP Cecilie Kicks, NP . Kathyrn Drown, NP

## 2019-01-15 NOTE — Progress Notes (Signed)
Carelink Summary Report / Loop Recorder 

## 2019-01-22 ENCOUNTER — Other Ambulatory Visit: Payer: Self-pay

## 2019-01-22 ENCOUNTER — Ambulatory Visit (HOSPITAL_COMMUNITY): Payer: No Typology Code available for payment source | Attending: Cardiology

## 2019-01-22 DIAGNOSIS — I493 Ventricular premature depolarization: Secondary | ICD-10-CM | POA: Insufficient documentation

## 2019-01-24 ENCOUNTER — Telehealth: Payer: Self-pay

## 2019-01-24 NOTE — Telephone Encounter (Signed)
Chart sent to me my mistake Will forward to Dr. [name]

## 2019-01-24 NOTE — Telephone Encounter (Signed)
Will try to see how that works but still may need to get 48 hour holter should f/u with EP in 3 weeks

## 2019-01-24 NOTE — Telephone Encounter (Signed)
Consulted [employer] representative [employer] concerning recording PVCs with LINQ I. Patient should be able to record PVCs using symptom activator when he feels them.  Spoke with patient concerning PVCs and using symptom activator to record episodes. Education done about use of symptom activator when he feels palpitations. To keep symptom activator with him and record every event he feels and notify DC if he has recorded multiple events during a 24 hr period.

## 2019-01-24 NOTE — Telephone Encounter (Addendum)
Talked to [facility] about patient. One of the nurses will call him about his PVC's and what to do when he has them. At this time, they do not think patient needs 48 hour holter monitor, that his loop recorder can record these PVC's.    Notes recorded by [name], MD on 01/24/2019 at 7:40 AM EDT  Do 48 hour monitor to quantitate percent of PVCls and refer to EP continue beta blockers  ------   Notes recorded by Michaelyn Barter, RN on 01/23/2019 at 5:13 PM EDT  Patient is concerned about having PVC's. Patient stated he has them all the time and some days they are more constant than others. Patient does not want to take any additional medications. Patient would like to know if there is anything he can do about these PVC's. Will forward to Dr. [name].  ------   Notes recorded by Michaelyn Barter, RN on 01/23/2019 at 5:10 PM EDT  Called patient with results. Per Dr. [name], low normal EF no bad valves overall good. Patient verbalized understanding and had a questions for Dr. [name].   Patient is concerned about having PVC's. Patient stated he has them all the time and some days they are more constant than others. Patient does not want to take any additional medications. Patient would like to know if there is anything he can do about these PVC's. Will forward to Dr. [name].  ------   Notes recorded by [name], MD on 01/22/2019 at 2:42 PM EDT  Low normal EF no bad valves overall good

## 2019-01-25 NOTE — Telephone Encounter (Signed)
Will send message to EP scheduler to set patient up for appointment.

## 2019-01-29 ENCOUNTER — Other Ambulatory Visit: Payer: Self-pay | Admitting: Cardiovascular Disease

## 2019-01-29 MED ORDER — VASCEPA 1 G PO CAPS: 2.0000 g | ORAL_CAPSULE | Freq: Two times a day (BID) | ORAL | 3 refills | Status: DC

## 2019-01-29 NOTE — Telephone Encounter (Signed)
Pt's medication was sent to pt's pharmacy as requested. Confirmation received.  °

## 2019-02-11 ENCOUNTER — Ambulatory Visit (INDEPENDENT_AMBULATORY_CARE_PROVIDER_SITE_OTHER): Payer: No Typology Code available for payment source | Admitting: *Deleted

## 2019-02-11 DIAGNOSIS — I639 Cerebral infarction, unspecified: Secondary | ICD-10-CM

## 2019-02-12 LAB — CUP PACEART REMOTE DEVICE CHECK
Date Time Interrogation Session: 20201019142331
Implantable Pulse Generator Implant Date: 20180306

## 2019-02-14 ENCOUNTER — Telehealth: Payer: Self-pay

## 2019-02-18 ENCOUNTER — Encounter: Payer: Self-pay | Admitting: Internal Medicine

## 2019-02-18 ENCOUNTER — Telehealth (INDEPENDENT_AMBULATORY_CARE_PROVIDER_SITE_OTHER): Payer: No Typology Code available for payment source | Admitting: Internal Medicine

## 2019-02-18 VITALS — Ht 71.0 in | Wt 270.0 lb

## 2019-02-18 DIAGNOSIS — I639 Cerebral infarction, unspecified: Secondary | ICD-10-CM | POA: Diagnosis not present

## 2019-02-18 DIAGNOSIS — I493 Ventricular premature depolarization: Secondary | ICD-10-CM

## 2019-02-18 NOTE — Progress Notes (Signed)
Electrophysiology TeleHealth Note   Due to national recommendations of social distancing due to COVID 19, an audio/video telehealth visit is felt to be most appropriate for this patient at this time.  See MyChart message from today for the patient's consent to telehealth for [facility].  Date:  02/18/2019   ID:  Erik [name], DOB Dec 06, 1955, MRN [id number]  Location: patient's home  Provider location:  [facility]  Evaluation Performed: Follow-up visit  PCP:  Alfonso Ellis, NP   Electrophysiologist:  Dr Rayann Heman  Chief Complaint:  palpitations  History of Present Illness:    Erik [name] is a 63 y.o. male who presents via telehealth conferencing today.  Since last being seen in our clinic, the patient reports doing very well.  He has occasional PVCs.  Though he notices these daily, but describes these as "not that bad".   Rarely, he has fatigue/ exhaustion and breathlessness.  In general, however he is doing well.  Today, he denies symptoms of palpitations, chest pain, shortness of breath,  lower extremity edema, dizziness, presyncope, or syncope.  The patient is otherwise without complaint today.  The patient denies symptoms of fevers, chills, cough, or new SOB worrisome for COVID 19.  Past Medical History:  Diagnosis Date  . Cancer [facility])    Renal cancer s/p Right nephrectomy  . Chest pain 04/13/2016  . CKD (chronic kidney disease) stage 3, GFR 30-59 ml/min 09/01/2015  . Coronary artery disease   . Essential hypertension with goal blood pressure less than 130/85 09/01/2015  . Hx of CABG   . Hypertension   . Renal disorder     Past Surgical History:  Procedure Laterality Date  . CARDIAC CATHETERIZATION N/A 04/15/2016   Procedure: Left Heart Cath and Cors/Grafts Angiography;  Surgeon: Lorretta Harp, MD;  Location: [place] [facility];  Service: Cardiovascular;  Laterality: N/A;  . CARDIAC SURGERY    . LOOP RECORDER INSERTION N/A 06/28/2016   Procedure: Loop Recorder  Insertion;  Surgeon: [name], MD;  Location: [place] [facility];  Service: Cardiovascular;  Laterality: N/A;  . NEPHRECTOMY Right     Current Outpatient Medications  Medication Sig Dispense Refill  . acetaminophen (TYLENOL) 500 MG tablet Take 500-1,000 mg by mouth every 6 (six) hours as needed (for arthritic pain).    Marland Kitchen amLODipine (NORVASC) 10 MG tablet Take 10 mg by mouth as directed.    Marland Kitchen aspirin EC 81 MG tablet Take 1 tablet (81 mg total) by mouth daily.    . Cholecalciferol (VITAMIN D3) 2000 units capsule Take 2,000 Units by mouth daily.    . clopidogrel (PLAVIX) 75 MG tablet Take 75 mg by mouth daily.    . Evolocumab (REPATHA SURECLICK) XX123456 MG/ML SOAJ Inject 140 mg into the skin every 14 (fourteen) days. 2 pen 11  . furosemide (LASIX) 20 MG tablet Take 20 mg by mouth daily.    Vanessa Kick Ethyl (VASCEPA) 1 g CAPS Take 2 capsules (2 g total) by mouth 2 (two) times daily. 360 capsule 3  . isosorbide mononitrate (IMDUR) 30 MG 24 hr tablet Take 1 tablet (30 mg total) by mouth daily. 90 tablet 1  . lisinopril (PRINIVIL,ZESTRIL) 5 MG tablet Take 1 tablet (5 mg total) by mouth daily. 30 tablet 3  . magnesium oxide (MAG-OX) 400 MG tablet Take 400 mg by mouth 2 (two) times daily.    . metoprolol succinate (TOPROL-XL) 100 MG 24 hr tablet Take 100 mg by mouth daily.    Marland Kitchen  nitroGLYCERIN (NITROSTAT) 0.4 MG SL tablet Place 1 tablet (0.4 mg total) under the tongue every 5 (five) minutes as needed for chest pain. 25 tablet 3  . Soft Lens Products (B & L SENSITIVE EYES SALINE) 0.4 % SOLN Place 1-2 drops into both eyes daily as needed (for contact lenses).    . vitamin B-12 (CYANOCOBALAMIN) 250 MCG tablet Take 250 mcg by mouth daily.      No current facility-administered medications for this visit.     Allergies:   Rosuvastatin and Zetia [ezetimibe]   Social History:  The patient  reports that he has never smoked. He has never used smokeless tobacco. He reports current alcohol use of about 2.0  standard drinks of alcohol per week. He reports that he does not use drugs.   Family History:  The patient's family history includes Heart attack in his father; Heart attack (age of onset: 54) in his brother.   ROS:  Please see the history of present illness.   All other systems are personally reviewed and negative.    Exam:    Vital Signs:  Ht 5\' 11"  (1.803 m)   Wt 270 lb (122.5 kg)   BMI 37.66 kg/m   Well sounding and appearing, alert and conversant, regular work of breathing,  good skin color Eyes- anicteric, neuro- grossly intact, skin- no apparent rash or lesions or cyanosis, mouth- oral mucosa is pink  Labs/Other Tests and Data Reviewed:    Recent Labs: 04/13/2018: BUN 27; Creatinine, Ser 2.06; Potassium 4.6; Sodium 139 01/10/2019: ALT 26   Wt Readings from Last 3 Encounters:  02/18/19 270 lb (122.5 kg)  01/10/19 277 lb (125.6 kg)  04/13/18 284 lb (128.8 kg)     Last device remote is reviewed from [place] PDF which reveals normal device function, no arrhythmias  Symptomatic episodes are due to PVCs.  Echo reviewed with patient today.  ASSESSMENT & PLAN:    1.  PVCs He has occasional PVCs These are noted on symptomatic ILR activation. Too infrequent for ablation I think we should avoid AAD therapy currently Continue toprol Avoid ETOH/ caffeine Regular exercise and weight reduction encouraged Could consider mexiletine if these continue  2. cryptogenic stroke No afib on ILR Continue to monitor  3. Obesity Body mass index is 37.66 kg/m. Lifestyle modification is encouraged  Follow-up:  Return to see EP APP in a year   Patient Risk:  after full review of this patients clinical status, I feel that they are at moderate risk at this time.  Today, I have spent 15 minutes with the patient with telehealth technology discussing arrhythmia management .    SignedThompson Grayer, MD  02/18/2019 2:55 PM     [facility] [address] [place]  [place] [address] [phone] (office) [phone] (fax)

## 2019-03-04 NOTE — Progress Notes (Signed)
Carelink Summary Report / Loop Recorder 

## 2019-03-17 LAB — CUP PACEART REMOTE DEVICE CHECK
Date Time Interrogation Session: 20201121101217
Implantable Pulse Generator Implant Date: 20180306

## 2019-03-18 ENCOUNTER — Ambulatory Visit (INDEPENDENT_AMBULATORY_CARE_PROVIDER_SITE_OTHER): Payer: No Typology Code available for payment source | Admitting: *Deleted

## 2019-03-18 DIAGNOSIS — I639 Cerebral infarction, unspecified: Secondary | ICD-10-CM | POA: Diagnosis not present

## 2019-04-18 ENCOUNTER — Ambulatory Visit (INDEPENDENT_AMBULATORY_CARE_PROVIDER_SITE_OTHER): Payer: No Typology Code available for payment source | Admitting: *Deleted

## 2019-04-18 DIAGNOSIS — I639 Cerebral infarction, unspecified: Secondary | ICD-10-CM

## 2019-04-18 LAB — CUP PACEART REMOTE DEVICE CHECK
Date Time Interrogation Session: 20201224100830
Implantable Pulse Generator Implant Date: 20180306

## 2019-04-21 NOTE — Progress Notes (Signed)
ILR remote 

## 2019-05-20 ENCOUNTER — Ambulatory Visit (INDEPENDENT_AMBULATORY_CARE_PROVIDER_SITE_OTHER): Payer: No Typology Code available for payment source | Admitting: *Deleted

## 2019-05-20 DIAGNOSIS — I639 Cerebral infarction, unspecified: Secondary | ICD-10-CM

## 2019-05-20 LAB — CUP PACEART REMOTE DEVICE CHECK
Date Time Interrogation Session: 20210124231005
Implantable Pulse Generator Implant Date: 20180306

## 2019-06-20 ENCOUNTER — Ambulatory Visit (INDEPENDENT_AMBULATORY_CARE_PROVIDER_SITE_OTHER): Payer: No Typology Code available for payment source | Admitting: *Deleted

## 2019-06-20 DIAGNOSIS — I639 Cerebral infarction, unspecified: Secondary | ICD-10-CM | POA: Diagnosis not present

## 2019-06-20 LAB — CUP PACEART REMOTE DEVICE CHECK
Date Time Interrogation Session: 20210224234933
Implantable Pulse Generator Implant Date: 20180306

## 2019-06-20 NOTE — Progress Notes (Signed)
ILR Remote 

## 2019-07-22 ENCOUNTER — Ambulatory Visit (INDEPENDENT_AMBULATORY_CARE_PROVIDER_SITE_OTHER): Payer: No Typology Code available for payment source | Admitting: *Deleted

## 2019-07-22 DIAGNOSIS — I639 Cerebral infarction, unspecified: Secondary | ICD-10-CM | POA: Diagnosis not present

## 2019-07-22 LAB — CUP PACEART REMOTE DEVICE CHECK
Date Time Interrogation Session: 20210328021340
Implantable Pulse Generator Implant Date: 20180306

## 2019-07-23 NOTE — Progress Notes (Signed)
ILR Remote 

## 2019-08-02 NOTE — Progress Notes (Signed)
Telehealth Visit     Virtual Visit via Telephone Note   This visit type was conducted due to national recommendations for restrictions regarding the COVID-19 Pandemic (e.g. social distancing) in an effort to limit this patient's exposure and mitigate transmission in our community.  Due to his co-morbid illnesses, this patient is at least at moderate risk for complications without adequate follow up.  This format is felt to be most appropriate for this patient at this time.  The patient did not have access to video technology/had technical difficulties with video requiring transitioning to audio format only (telephone).  All issues noted in this document were discussed and addressed.  No physical exam could be performed with this format.  Please refer to the patient's chart for his  consent to telehealth for [facility].   Evaluation Performed:  Follow-up visit  This visit type was conducted due to national recommendations for restrictions regarding the COVID-19 Pandemic (e.g. social distancing).  This format is felt to be most appropriate for this patient at this time.  All issues noted in this document were discussed and addressed.  No physical exam was performed (except for noted visual exam findings with Video Visits).  Please refer to the patient's chart (MyChart message for video visits and phone note for telephone visits) for the patient's consent to telehealth for [facility].  Date:  08/06/2019   ID:  Erik [name], DOB 10/09/1955, MRN [id number]  Patient Location:  Car (driving)  Provider location:   Home  PCP:  Alfonso Ellis, NP  Cardiologist:  Jenkins Rouge, MD  Electrophysiologist:  None   Chief Complaint:  Follow up  History of Present Illness:    Erik [name] is a 64 y.o. male who presents via audio/video conferencing for a telehealth visit today.  Seen for Dr. [name].   He has a history of known CAD with prior CABG in 2009, prior stroke, CKD, prior right nephrectomy  in 2007, HLD with statin intolerance - now on Repatha and Vascepa, and HTN.  He has ILR in place since 2018. Has had palpitations with ILR showing rare PVCs - no PAF. Cardiac catheterization in December 2017 demonstrated 2 out of 4 grafts patent.  There was a diagonal (not grafted) with 90% ostial stenosis prior to the occlusion of the LAD which may have caused ischemia on the prior nuclear stress test prior to his cardiac catheterization.  Medical therapy was recommended.   Has had some mild LV dysfunction on prior echo.   Last seen by Dr. [name] in September of 2020 - wanting to stop Fenofibrate. Echo was updated. He was noting more PVCs. Had a video visit with Dr. Rayann Heman back in October - to continue with beta blocker therapy.     The patient does not have symptoms concerning for COVID-19 infection (fever, chills, cough, or new shortness of breath).   Seen today by a telephone call - declined [place] video. He has consented for this visit. He is driving while we talk. His PVCs remain his biggest issue - still pretty bothersome for him.  Otherwise he says he is fine. Does not drink alcohol. Maybe has a cup or two of coffee - no Starbucks. He will take some Magnesium on occasion - this seems to help. He is taking less than what is listed on his chart. Notes his spells are random. This can last for various times - sometimes up to 1 to 2 days.  HR typically in the 70's. Labs are checked by  PCP thru [place] - noted in [place].   Past Medical History:  Diagnosis Date  . Cancer [facility])    Renal cancer s/p Right nephrectomy  . Chest pain 04/13/2016  . CKD (chronic kidney disease) stage 3, GFR 30-59 ml/min 09/01/2015  . Coronary artery disease   . Essential hypertension with goal blood pressure less than 130/85 09/01/2015  . Hx of CABG   . Hypertension   . Renal disorder    Past Surgical History:  Procedure Laterality Date  . CARDIAC CATHETERIZATION N/A 04/15/2016   Procedure: Left Heart Cath  and Cors/Grafts Angiography;  Surgeon: Lorretta Harp, MD;  Location: [place] [facility];  Service: Cardiovascular;  Laterality: N/A;  . CARDIAC SURGERY    . LOOP RECORDER INSERTION N/A 06/28/2016   Procedure: Loop Recorder Insertion;  Surgeon: [name], MD;  Location: [place] [facility];  Service: Cardiovascular;  Laterality: N/A;  . NEPHRECTOMY Right      Current Meds  Medication Sig  . acetaminophen (TYLENOL) 500 MG tablet Take 500-1,000 mg by mouth every 6 (six) hours as needed (for arthritic pain).  Marland Kitchen amLODipine (NORVASC) 10 MG tablet Take 10 mg by mouth as directed.  Marland Kitchen aspirin EC 81 MG tablet Take 1 tablet (81 mg total) by mouth daily.  . Cholecalciferol (VITAMIN D3) 2000 units capsule Take 2,000 Units by mouth daily.  . clopidogrel (PLAVIX) 75 MG tablet Take 75 mg by mouth daily.  . Evolocumab (REPATHA SURECLICK) XX123456 MG/ML SOAJ Inject 140 mg into the skin every 14 (fourteen) days.  . furosemide (LASIX) 20 MG tablet Take 20 mg by mouth daily.  Vanessa Kick Ethyl (VASCEPA) 1 g CAPS Take 2 capsules (2 g total) by mouth 2 (two) times daily.  . isosorbide mononitrate (IMDUR) 30 MG 24 hr tablet Take 1 tablet (30 mg total) by mouth daily.  Marland Kitchen lisinopril (PRINIVIL,ZESTRIL) 5 MG tablet Take 1 tablet (5 mg total) by mouth daily.  . magnesium oxide (MAG-OX) 400 MG tablet Take 400 mg by mouth 2 (two) times daily.  . metoprolol succinate (TOPROL-XL) 100 MG 24 hr tablet Take 100 mg by mouth daily.  . nitroGLYCERIN (NITROSTAT) 0.4 MG SL tablet Place 1 tablet (0.4 mg total) under the tongue every 5 (five) minutes as needed for chest pain.  . vitamin B-12 (CYANOCOBALAMIN) 250 MCG tablet Take 250 mcg by mouth daily.   . [DISCONTINUED] Soft Lens Products (B & L SENSITIVE EYES SALINE) 0.4 % SOLN Place 1-2 drops into both eyes daily as needed (for contact lenses).     Allergies:   Rosuvastatin and Zetia [ezetimibe]   Social History   Tobacco Use  . Smoking status: Never Smoker  . Smokeless  tobacco: Never Used  Substance Use Topics  . Alcohol use: Yes    Alcohol/week: 2.0 standard drinks    Types: 2 Shots of liquor per week  . Drug use: No     Family Hx: The patient's family history includes Heart attack in his father; Heart attack (age of onset: 60) in his brother.  ROS:   Please see the history of present illness.   All other systems reviewed are negative.    Objective:    Vital Signs:  Ht 6' (1.829 m)   Wt 283 lb (128.4 kg)   BMI 38.38 kg/m    Wt Readings from Last 3 Encounters:  08/06/19 283 lb (128.4 kg)  02/18/19 270 lb (122.5 kg)  01/10/19 277 lb (125.6 kg)    Alert  male in no acute distress. He is not short of breath with conversation.    Labs/Other Tests and Data Reviewed:    Lab Results  Component Value Date   WBC 6.6 04/16/2016   HGB 15.1 04/16/2016   HCT 44.1 04/16/2016   PLT 152 04/16/2016   GLUCOSE 103 (H) 04/13/2018   CHOL 125 01/10/2019   TRIG 144 01/10/2019   HDL 48 01/10/2019   LDLDIRECT 124 (H) 12/04/2017   LDLCALC 52 01/10/2019   ALT 26 01/10/2019   AST 21 01/10/2019   NA 139 04/13/2018   K 4.6 04/13/2018   CL 105 04/13/2018   CREATININE 2.06 (H) 04/13/2018   BUN 27 04/13/2018   CO2 23 04/13/2018   INR 0.94 04/14/2016     BNP (last 3 results) No results for input(s): BNP in the last 8760 hours.  ProBNP (last 3 results) No results for input(s): PROBNP in the last 8760 hours.    Prior CV studies:    The following studies were reviewed today:  ECHO IMPRESSIONS 12/2018   1. Left ventricular ejection fraction, by visual estimation, is 50 to  55%. The left ventricle has normal function. Normal left ventricular size.  There is no left ventricular hypertrophy.  2. Left ventricular diastolic Doppler parameters are consistent with  impaired relaxation pattern of LV diastolic filling.  3. Global right ventricle has normal systolic function.The right  ventricular size is mildly enlarged. No increase in right  ventricular wall  thickness.  4. Left atrial size was mildly dilated.  5. Right atrial size was normal.  6. Mild mitral annular calcification.  7. The mitral valve is normal in structure. Trace mitral valve  regurgitation. No evidence of mitral stenosis.  8. The tricuspid valve is normal in structure. Tricuspid valve  regurgitation was not visualized by color flow Doppler.  9. The aortic valve is normal in structure. Aortic valve regurgitation  was not visualized by color flow Doppler. Structurally normal aortic  valve, with no evidence of sclerosis or stenosis.  10. The pulmonic valve was normal in structure. Pulmonic valve  regurgitation is mild by color flow Doppler.  11. Normal pulmonary artery systolic pressure.  12. The inferior vena cava is normal in size with greater than 50%  respiratory variability, suggesting right atrial pressure of 3 mmHg.    ASSESSMENT & PLAN:    1. 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 Jackalyn Lombard last note that Mexiletine could be an option going forward. Noted to be too infrequent to consider ablation.   2. CAD - remote CABG from 2009 - last cath in 2017 with patent LIMA to LAD, patent SVG to D2, occluded SVG to RCA with left to right collaterals to distal RCA and occluded SVG to OM2 with 50% native mLCX disease - he has no worrisome symptoms. Continue CV risk factor modification.   3. HTN - BP is ok per his report.   4. CKD - follows with Renal - has had prior nephrectomy as well.  5. Prior stroke - ILR is in place - no PAF noted.   6. HLD - on Repatha and Vascepa - labs by PCP  7. COVID-19 Education: The signs and symptoms of COVID-19 were discussed with the patient and how to seek care for testing (follow up with PCP or arrange E-visit).  The importance of social distancing, staying at  home, hand hygiene and wearing a mask when out in public  were discussed today.  Patient Risk:   After full review of this patient's clinical status, I feel that they are at least moderate risk at this time.  Time:   Today, I have spent 10 minutes with the patient with telehealth technology discussing the above issues.     Medication Adjustments/Labs and Tests Ordered: Current medicines are reviewed at length with the patient today.  Concerns regarding medicines are outlined above.   Tests Ordered: No orders of the defined types were placed in this encounter.   Medication Changes: No orders of the defined types were placed in this encounter.   Disposition:  FU with Dr. [name] in 6 months. Has recall with EP.     Patient is agreeable to this plan and will call if any problems develop in the interim.   Amie Critchley, NP  08/06/2019 3:47 PM    [facility]

## 2019-08-06 ENCOUNTER — Other Ambulatory Visit: Payer: Self-pay

## 2019-08-06 ENCOUNTER — Telehealth (INDEPENDENT_AMBULATORY_CARE_PROVIDER_SITE_OTHER): Payer: No Typology Code available for payment source | Admitting: Nurse Practitioner

## 2019-08-06 ENCOUNTER — Encounter: Payer: Self-pay | Admitting: Nurse Practitioner

## 2019-08-06 VITALS — Ht 72.0 in | Wt 283.0 lb

## 2019-08-06 DIAGNOSIS — I493 Ventricular premature depolarization: Secondary | ICD-10-CM

## 2019-08-06 DIAGNOSIS — Z7189 Other specified counseling: Secondary | ICD-10-CM

## 2019-08-06 DIAGNOSIS — I10 Essential (primary) hypertension: Secondary | ICD-10-CM

## 2019-08-06 DIAGNOSIS — I25119 Atherosclerotic heart disease of native coronary artery with unspecified angina pectoris: Secondary | ICD-10-CM

## 2019-08-06 DIAGNOSIS — I639 Cerebral infarction, unspecified: Secondary | ICD-10-CM

## 2019-08-06 DIAGNOSIS — E785 Hyperlipidemia, unspecified: Secondary | ICD-10-CM

## 2019-08-06 MED ORDER — PROPRANOLOL HCL 10 MG PO TABS: 10.0000 mg | ORAL_TABLET | Freq: Four times a day (QID) | ORAL | 3 refills | Status: DC | PRN

## 2019-08-06 NOTE — Patient Instructions (Addendum)
After Visit Summary:  We will be checking the following labs today - NONE   Medication Instructions:    Continue with your current medicines. BUT  I have sent in a RX for Inderal 10 mg to take as needed for palpitations - can take up to 4 times a day   If you need a refill on your cardiac medications before your next appointment, please call your pharmacy.     Testing/Procedures To Be Arranged:  N/A  Follow-Up:   See Dr. [name] in 6 months  Continue follow up with Dr. Rayann Heman     At [facility], you and your health needs are our priority.  As part of our continuing mission to provide you with exceptional heart care, we have created designated Provider Care Teams.  These Care Teams include your primary Cardiologist (physician) and Advanced Practice Providers (APPs -  Physician Assistants and Nurse Practitioners) who all work together to provide you with the care you need, when you need it.  Special Instructions:  . Stay safe, stay home, wash your hands for at least 20 seconds and wear a mask when out in public.  . It was good to talk with you today.    Call the [place] office at [phone] if you have any questions, problems or concerns.

## 2019-08-22 LAB — CUP PACEART REMOTE DEVICE CHECK
Date Time Interrogation Session: 20210428232020
Implantable Pulse Generator Implant Date: 20180306

## 2019-08-26 ENCOUNTER — Ambulatory Visit (INDEPENDENT_AMBULATORY_CARE_PROVIDER_SITE_OTHER): Payer: No Typology Code available for payment source | Admitting: *Deleted

## 2019-08-26 DIAGNOSIS — I639 Cerebral infarction, unspecified: Secondary | ICD-10-CM | POA: Diagnosis not present

## 2019-08-26 DIAGNOSIS — R002 Palpitations: Secondary | ICD-10-CM

## 2019-08-27 NOTE — Progress Notes (Signed)
Carelink Summary Report / Loop Recorder 

## 2019-09-23 LAB — CUP PACEART REMOTE DEVICE CHECK
Date Time Interrogation Session: 20210529232829
Implantable Pulse Generator Implant Date: 20180306

## 2019-09-24 ENCOUNTER — Ambulatory Visit (INDEPENDENT_AMBULATORY_CARE_PROVIDER_SITE_OTHER): Payer: No Typology Code available for payment source | Admitting: *Deleted

## 2019-09-24 DIAGNOSIS — I639 Cerebral infarction, unspecified: Secondary | ICD-10-CM

## 2019-09-25 NOTE — Progress Notes (Signed)
Carelink Summary Report / Loop Recorder 

## 2019-10-01 NOTE — Addendum Note (Signed)
Addended by: Douglass Rivers D on: 10/01/2019 10:55 AM   Modules accepted: Level of Service

## 2019-10-25 ENCOUNTER — Ambulatory Visit (INDEPENDENT_AMBULATORY_CARE_PROVIDER_SITE_OTHER): Payer: No Typology Code available for payment source | Admitting: *Deleted

## 2019-10-25 DIAGNOSIS — I639 Cerebral infarction, unspecified: Secondary | ICD-10-CM | POA: Diagnosis not present

## 2019-10-25 LAB — CUP PACEART REMOTE DEVICE CHECK
Date Time Interrogation Session: 20210701230946
Implantable Pulse Generator Implant Date: 20180306

## 2019-10-29 NOTE — Progress Notes (Signed)
Carelink Summary Report / Loop Recorder 

## 2019-11-25 ENCOUNTER — Telehealth: Payer: Self-pay

## 2019-11-25 NOTE — Telephone Encounter (Signed)
Carelink alert received-  Linq ILR at RRT.  Spoke with pt advised he can unplug bedside monitor, will send return kit.  Pt advised if he would like for device to be removed, schedule in clinic appt.

## 2020-04-01 ENCOUNTER — Telehealth: Payer: Self-pay

## 2020-04-01 NOTE — Telephone Encounter (Signed)
Called and lmomed the pt regarding the lack of insurance and that we need a new card asap to process pa for repatha

## 2020-04-02 MED ORDER — ICOSAPENT ETHYL 1 G PO CAPS: 2.0000 g | ORAL_CAPSULE | Freq: Two times a day (BID) | ORAL | 11 refills | Status: AC

## 2020-04-02 NOTE — Telephone Encounter (Signed)
pt no longer has insurance can you assist with vascepa? I am sending pt assistance forms by email for repatha to [email or web address] will route to pam ingalls rn for dr. [name].

## 2020-04-02 NOTE — Telephone Encounter (Addendum)
Vascepa does not have a patient assistance program unfortunately. He could either fill at a [facility] (they have the best GoodRx rate at $84 for a 1 month supply of Vascepa). Otherwise could change to [facility] (this is $26 at [employer] using GoodRx for a 1 month supply). Would prefer for pt to stay on Vascepa if possible due its cardiovascular benefits.  Spoke with pt and he is ok paying $84/month for Vascepa. Rx sent to [place] along with GoodRx coupon card.

## 2020-04-02 NOTE — Addendum Note (Signed)
Addended by: Rayford Williamsen E on: 04/02/2020 09:41 AM   Modules accepted: Orders

## 2020-04-21 ENCOUNTER — Telehealth: Payer: Self-pay

## 2020-04-21 NOTE — Telephone Encounter (Signed)
Called and lmomed the pt that they were denied from snf and to apply for medicare as the denial letter requested. I also instructed him to call us back if he has any questions for [place] at [facility]

## 2020-06-26 ENCOUNTER — Telehealth: Payer: Self-pay

## 2020-06-26 NOTE — Telephone Encounter (Signed)
Received EKG from patient's PCP to review and compare with patient's last EKG. Had DOD, Dr. Irish Lack look at both EKG. There is no significant change. Will route note to PCP, Alfonso Ellis NP. Will have EKG scanned into patient's chart.

## 2020-07-06 ENCOUNTER — Telehealth: Payer: Self-pay

## 2020-07-06 NOTE — Telephone Encounter (Signed)
Called pt to get insurance information as follows: [id number] [id number]

## 2020-07-06 NOTE — Telephone Encounter (Signed)
-----   Message from Ramond Dial, [place] sent at 07/06/2020  2:53 PM EDT ----- Pt called and LVM that he has new insurance and needs PA done for Repatha. Can you call him to get insurance info and do PA please

## 2020-07-07 MED ORDER — REPATHA SURECLICK 140 MG/ML ~~LOC~~ SOAJ: 1.0000 "pen " | SUBCUTANEOUS | 11 refills | Status: AC

## 2020-07-07 NOTE — Addendum Note (Signed)
Addended by: Marcelle Overlie D on: 07/07/2020 08:50 AM   Modules accepted: Orders

## 2020-07-07 NOTE — Telephone Encounter (Signed)
PA for Repatha approved through 07/06/2021.

## 2020-07-07 NOTE — Telephone Encounter (Signed)
Called and spoke w/pt regarding the approval of the repatha, rx sent,  and [employer] grant pt voiced understanding

## 2020-07-22 NOTE — Progress Notes (Signed)
Cardiology Office Note    Date:  07/28/2020   ID:  Erik [name], DOB 1955-08-09, MRN [id number]   PCP:  Erik Ellis, NP   [place]  Cardiologist:  Jenkins Rouge, MD  Advanced Practice Provider:  No care team member to display Electrophysiologist:  None   [phone]   Chief Complaint  Patient presents with  . Follow-up    History of Present Illness:  Erik [name] is a 65 y.o. male with history of CAD status post CABG in 2009, cath 03/2016 2/4 grafts patent, 90% ostial diagonal prior to the LAD occlusion may have caused ischemia on the NST.  Medical therapy recommended.  HLD intolerant to statins on Repatha and Vascepa, ischemic cardiomyopathy EF 50-55% on echo 01/22/2019.  With history of cryptogenic stroke with ILR that noted PVCs and seen by Dr. Rayann Heman.  Too infrequent for ablation and he want to avoid AAD therapy.  Avoid alcohol and caffeine.  Continue Toprol and consider mexiletine if continues.  Patient comes in for f/u. Heart skips all the time. 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 He typically sits down and drinks water and feels better. ILR no longer working. Drinks 2 cups coffee daily. No other caffeine and no alcohol. He is very symptomatic with his heart skipping and has become very bothersome. May be causing dizziness. No chest pain, fairly active in his shop.   Past Medical History:  Diagnosis Date  . Cancer [facility])    Renal cancer s/p Right nephrectomy  . Chest pain 04/13/2016  . CKD (chronic kidney disease) stage 3, GFR 30-59 ml/min (HCC) 09/01/2015  . Coronary artery disease   . Essential hypertension with goal blood pressure less than 130/85 09/01/2015  . Hx of CABG   . Hypertension   . Renal disorder     Past Surgical History:  Procedure Laterality Date  . CARDIAC CATHETERIZATION N/A 04/15/2016    Procedure: Left Heart Cath and Cors/Grafts Angiography;  Surgeon: Lorretta Harp, MD;  Location: [facility];  Service: Cardiovascular;  Laterality: N/A;  . CARDIAC SURGERY    . LOOP RECORDER INSERTION N/A 06/28/2016   Procedure: Loop Recorder Insertion;  Surgeon: [name], MD;  Location: [facility];  Service: Cardiovascular;  Laterality: N/A;  . NEPHRECTOMY Right     Current Medications: Current Meds  Medication Sig  . acetaminophen (TYLENOL) 500 MG tablet Take 500-1,000 mg by mouth every 6 (six) hours as needed (for arthritic pain).  Marland Kitchen amLODipine (NORVASC) 10 MG tablet Take 10 mg by mouth as directed.  Marland Kitchen aspirin EC 81 MG tablet Take 1 tablet (81 mg total) by mouth daily.  . Cholecalciferol (VITAMIN D3) 2000 units capsule Take 2,000 Units by mouth daily.  . clopidogrel (PLAVIX) 75 MG tablet Take 75 mg by mouth daily.  . Evolocumab (REPATHA SURECLICK) 211 MG/ML SOAJ Inject 1 pen into the skin every 14 (fourteen) days.  Marland Kitchen icosapent Ethyl (VASCEPA) 1 g capsule Take 2 capsules (2 g total) by mouth 2 (two) times daily.  . isosorbide mononitrate (IMDUR) 30 MG 24 hr tablet Take 1 tablet (30 mg total) by mouth daily.  Marland Kitchen lisinopril (PRINIVIL,ZESTRIL) 5 MG tablet Take 1 tablet (5 mg total) by mouth daily.  . magnesium oxide (MAG-OX) 400 MG tablet Take 400 mg by mouth daily.  . metoprolol succinate (  TOPROL-XL) 100 MG 24 hr tablet Take 100 mg by mouth daily.  . nitroGLYCERIN (NITROSTAT) 0.4 MG SL tablet Place 1 tablet (0.4 mg total) under the tongue every 5 (five) minutes as needed for chest pain.  . vitamin B-12 (CYANOCOBALAMIN) 250 MCG tablet Take 250 mcg by mouth daily.   . [DISCONTINUED] furosemide (LASIX) 20 MG tablet Take 20 mg by mouth daily.     Allergies:   Rosuvastatin and Zetia [ezetimibe]   Social History   Socioeconomic History  . Marital status: Single    Spouse name: Not on file  . Number of children: Not on file  . Years of education: Not on file  . Highest  education level: Not on file  Occupational History  . Not on file  Tobacco Use  . Smoking status: Never Smoker  . Smokeless tobacco: Never Used  Vaping Use  . Vaping Use: Never used  Substance and Sexual Activity  . Alcohol use: Yes    Alcohol/week: 2.0 standard drinks    Types: 2 Shots of liquor per week  . Drug use: No  . Sexual activity: Not on file  Other Topics Concern  . Not on file  Social History Narrative  . Not on file   Social Determinants of Health   Financial Resource Strain: Not on file  Food Insecurity: Not on file  Transportation Needs: Not on file  Physical Activity: Not on file  Stress: Not on file  Social Connections: Not on file     Family History:  The patient's family history includes Heart attack in his father; Heart attack (age of onset: 78) in his brother.   ROS:   Please see the history of present illness.    ROS All other systems reviewed and are negative.   PHYSICAL EXAM:   VS:  BP 110/70   Pulse 75   Ht 6' (1.829 m)   Wt 271 lb (122.9 kg)   SpO2 97%   BMI 36.75 kg/m   Physical Exam  GEN: Obese, in no acute distress  Neck: no JVD, carotid bruits, or masses Cardiac:RRR; no murmurs, rubs, or gallops  Respiratory:  clear to auscultation bilaterally, normal work of breathing GI: soft, nontender, nondistended, + BS Ext: without cyanosis, clubbing, or edema, Good distal pulses bilaterally Neuro:  Alert and Oriented x 3 Psych: euthymic mood, full affect  Wt Readings from Last 3 Encounters:  07/28/20 271 lb (122.9 kg)  08/06/19 283 lb (128.4 kg)  02/18/19 270 lb (122.5 kg)      Studies/Labs Reviewed:   EKG:  EKG is  ordered today.  The ekg ordered today demonstrates normal sinus rhythm with PVCs, no acute change  Recent Labs: No results found for requested labs within last 8760 hours.   Lipid Panel    Component Value Date/Time   CHOL 125 01/10/2019 1015   TRIG 144 01/10/2019 1015   HDL 48 01/10/2019 1015   CHOLHDL 2.6  01/10/2019 1015   CHOLHDL 5.4 04/15/2016 0339   VLDL 57 (H) 04/15/2016 0339   LDLCALC 52 01/10/2019 1015   LDLDIRECT 124 (H) 12/04/2017 1001    Additional studies/ records that were reviewed today include:  Echo 01/22/19 IMPRESSIONS     1. Left ventricular ejection fraction, by visual estimation, is 50 to  55%. The left ventricle has normal function. Normal left ventricular size.  There is no left ventricular hypertrophy.   2. Left ventricular diastolic Doppler parameters are consistent with  impaired relaxation pattern of LV  diastolic filling.   3. Global right ventricle has normal systolic function.The right  ventricular size is mildly enlarged. No increase in right ventricular wall  thickness.   4. Left atrial size was mildly dilated.   5. Right atrial size was normal.   6. Mild mitral annular calcification.   7. The mitral valve is normal in structure. Trace mitral valve  regurgitation. No evidence of mitral stenosis.   8. The tricuspid valve is normal in structure. Tricuspid valve  regurgitation was not visualized by color flow Doppler.   9. The aortic valve is normal in structure. Aortic valve regurgitation  was not visualized by color flow Doppler. Structurally normal aortic  valve, with no evidence of sclerosis or stenosis.  10. The pulmonic valve was normal in structure. Pulmonic valve  regurgitation is mild by color flow Doppler.  11. Normal pulmonary artery systolic pressure.  12. The inferior vena cava is normal in size with greater than 50%  respiratory variability, suggesting right atrial pressure of 3 mmHg.     Risk Assessment/Calculations:         ASSESSMENT:    1. Coronary artery disease involving coronary bypass graft without angina pectoris, unspecified whether native or transplanted heart   2. Essential hypertension   3. Stage 3 chronic kidney disease, unspecified whether stage 3a or 3b CKD ([place])   4. Completed stroke ([place])   5. Hyperlipidemia,  unspecified hyperlipidemia type   6. PVC (premature ventricular contraction)      PLAN:  In order of problems listed above:  Coronary artery disease with angina pectoris ([place]) - CABG 2009. Cath 2017 patent [place]a to LAD, patent SVG to D2 Occluded SVG to RCA with Left to right collaterals to distal RCA  Occluded SVG to OM2 with 50% native mid circumflex disease. No angina,stable      Essential hypertension -patient's blood pressure up a little in the morning and comes down nicely after he takes his meds but he thinks it might be getting too low as he has some dizziness.  Also having palpitations.  Reviewed his blood pressure monitor and blood pressure seems stable.  We will stop Lasix and ask him to take only as needed especially with creatinine of 2.31.     CKD (chronic kidney disease) stage 3, Cr 2. 313/2/22    History of stroke- ILR with no PAF but battery now not working.  Continue plavix    HLD:  Much improved on Vascepa and Repatha f/u lipid clinic LDL 47 Triglycerides 162 improved 07/09/18 with normal LfTls   PVC's (premature ventricular contractions)  -patient having a lot of PVCs and symptomatic with it.  He limits caffeine and drinks no alcohol.  He is on Toprol-XL 100 mg daily.  Echo 12/2018 LVEF 50-55%.  Potassium was 5.3 last month.  We will recheck labs today including magnesium order 2-week monitor and have him follow-up with Dr. Rayann Heman      Shared Decision Making/Informed Consent        Medication Adjustments/Labs and Tests Ordered: Current medicines are reviewed at length with the patient today.  Concerns regarding medicines are outlined above.  Medication changes, Labs and Tests ordered today are listed in the Patient Instructions below. Patient Instructions  Medication Instructions:  Your physician has recommended you make the following change in your medication:   CHANGE: Furosemide to 20mg  as needed  *If you need a refill on your cardiac medications before your next  appointment, please call your pharmacy*   Lab  Work: TODAY: BMET, MAG  If you have labs (blood work) drawn today and your tests are completely normal, you will receive your results only by: Marland Kitchen MyChart Message (if you have MyChart) OR . A paper copy in the mail If you have any lab test that is abnormal or we need to change your treatment, we will call you to review the results.  Follow-Up: At [facility], you and your health needs are our priority.  As part of our continuing mission to provide you with exceptional heart care, we have created designated Provider Care Teams.  These Care Teams include your primary Cardiologist (physician) and Advanced Practice Providers (APPs -  Physician Assistants and Nurse Practitioners) who all work together to provide you with the care you need, when you need it.  Your next appointment:   1 year(s)  The format for your next appointment:   In Person  Provider:   You may see Jenkins Rouge, MD or one of the following Advanced Practice Providers on your designated Care Team:    Kathyrn Drown, NP  Other Instructions ZIO XT- Long Term Monitor Instructions   Your physician has requested you wear your ZIO patch monitor___14____days.   This is a single patch monitor.  [employer] supplies one patch monitor per enrollment.  Additional stickers are not available.   Please do not apply patch if you will be having a Nuclear Stress Test, Echocardiogram, Cardiac CT, MRI, or Chest Xray during the time frame you would be wearing the monitor. The patch cannot be worn during these tests.  You cannot remove and re-apply the ZIO XT patch monitor.   Your ZIO patch monitor will be sent USPS Priority mail from [facility] directly to your home address. The monitor may also be mailed to a PO BOX if home delivery is not available.   It may take 3-5 days to receive your monitor after you have been enrolled.   Once you have received you monitor, please review enclosed  instructions.  Your monitor has already been registered assigning a specific monitor serial # to you.   Applying the monitor   Shave hair from upper left chest.   Hold abrader disc by orange tab.  Rub abrader in 40 strokes over left upper chest as indicated in your monitor instructions.   Clean area with 4 enclosed alcohol pads .  Use all pads to assure are is cleaned thoroughly.  Let dry.   Apply patch as indicated in monitor instructions.  Patch will be place under collarbone on left side of chest with arrow pointing upward.   Rub patch adhesive wings for 2 minutes.Remove white label marked "1".  Remove white label marked "2".  Rub patch adhesive wings for 2 additional minutes.   While looking in a mirror, press and release button in center of patch.  A small green light will flash 3-4 times .  This will be your only indicator the monitor has been turned on.     Do not shower for the first 24 hours.  You may shower after the first 24 hours.   Press button if you feel a symptom. You will hear a small click.  Record Date, Time and Symptom in the Patient Log Book.   When you are ready to remove patch, follow instructions on last 2 pages of Patient Log Book.  Stick patch monitor onto last page of Patient Log Book.   Place Patient Log Book in [place] box.  Use locking tab  on box and tape box closed securely.  The [employer] and [employer] has [employer] on it.  Please place in mailbox as soon as possible.  Your physician should have your test results approximately 7 days after the monitor has been mailed back to [facility].   Call [place] at [phone] if you have questions regarding your ZIO XT patch monitor.  Call them immediately if you see an orange light blinking on your monitor.   If your monitor falls off in less than 4 days contact our Monitor department at [phone].  If your monitor becomes loose or falls off after 4 days call [employer] at [phone] for  suggestions on securing your monitor.       Sumner Boast, PA-C  07/28/2020 1:07 PM    [facility], [place], [place]  [address] Phone: [phone]; Fax: [phone]

## 2020-07-28 ENCOUNTER — Encounter: Payer: Self-pay | Admitting: Physician Assistant

## 2020-07-28 ENCOUNTER — Ambulatory Visit (INDEPENDENT_AMBULATORY_CARE_PROVIDER_SITE_OTHER): Payer: Medicare Other

## 2020-07-28 ENCOUNTER — Ambulatory Visit: Payer: Medicare Other | Admitting: Physician Assistant

## 2020-07-28 ENCOUNTER — Other Ambulatory Visit: Payer: Self-pay

## 2020-07-28 ENCOUNTER — Encounter: Payer: Self-pay | Admitting: *Deleted

## 2020-07-28 VITALS — BP 110/70 | HR 75 | Ht 72.0 in | Wt 271.0 lb

## 2020-07-28 DIAGNOSIS — I493 Ventricular premature depolarization: Secondary | ICD-10-CM

## 2020-07-28 DIAGNOSIS — E785 Hyperlipidemia, unspecified: Secondary | ICD-10-CM

## 2020-07-28 DIAGNOSIS — I10 Essential (primary) hypertension: Secondary | ICD-10-CM | POA: Diagnosis not present

## 2020-07-28 DIAGNOSIS — I25810 Atherosclerosis of coronary artery bypass graft(s) without angina pectoris: Secondary | ICD-10-CM | POA: Diagnosis not present

## 2020-07-28 DIAGNOSIS — N183 Chronic kidney disease, stage 3 unspecified: Secondary | ICD-10-CM

## 2020-07-28 DIAGNOSIS — I639 Cerebral infarction, unspecified: Secondary | ICD-10-CM | POA: Diagnosis not present

## 2020-07-28 MED ORDER — FUROSEMIDE 20 MG PO TABS: 20.0000 mg | ORAL_TABLET | ORAL | 2 refills | Status: DC | PRN

## 2020-07-28 NOTE — Progress Notes (Signed)
Patient ID: Erik [name], male   DOB: [birth year], 65 y.o.   MRN: [id number] Patient enrolled for [employer] to ship a 14 day ZIO XT monitor to  [address] [place] , [place]  [address].

## 2020-07-28 NOTE — Patient Instructions (Signed)
Medication Instructions:  Your physician has recommended you make the following change in your medication:   CHANGE: Furosemide to 20mg  as needed  *If you need a refill on your cardiac medications before your next appointment, please call your pharmacy*   Lab Work: TODAY: BMET, MAG  If you have labs (blood work) drawn today and your tests are completely normal, you will receive your results only by: Marland Kitchen MyChart Message (if you have MyChart) OR . A paper copy in the mail If you have any lab test that is abnormal or we need to change your treatment, we will call you to review the results.  Follow-Up: At [facility], you and your health needs are our priority.  As part of our continuing mission to provide you with exceptional heart care, we have created designated Provider Care Teams.  These Care Teams include your primary Cardiologist (physician) and Advanced Practice Providers (APPs -  Physician Assistants and Nurse Practitioners) who all work together to provide you with the care you need, when you need it.  Your next appointment:   1 year(s)  The format for your next appointment:   In Person  Provider:   You may see Jenkins Rouge, MD or one of the following Advanced Practice Providers on your designated Care Team:    Kathyrn Drown, NP  Other Instructions ZIO XT- Long Term Monitor Instructions   Your physician has requested you wear your ZIO patch monitor___14____days.   This is a single patch monitor.  [employer] supplies one patch monitor per enrollment.  Additional stickers are not available.   Please do not apply patch if you will be having a Nuclear Stress Test, Echocardiogram, Cardiac CT, MRI, or Chest Xray during the time frame you would be wearing the monitor. The patch cannot be worn during these tests.  You cannot remove and re-apply the ZIO XT patch monitor.   Your ZIO patch monitor will be sent USPS Priority mail from [facility] directly to your home address.  The monitor may also be mailed to a PO BOX if home delivery is not available.   It may take 3-5 days to receive your monitor after you have been enrolled.   Once you have received you monitor, please review enclosed instructions.  Your monitor has already been registered assigning a specific monitor serial # to you.   Applying the monitor   Shave hair from upper left chest.   Hold abrader disc by orange tab.  Rub abrader in 40 strokes over left upper chest as indicated in your monitor instructions.   Clean area with 4 enclosed alcohol pads .  Use all pads to assure are is cleaned thoroughly.  Let dry.   Apply patch as indicated in monitor instructions.  Patch will be place under collarbone on left side of chest with arrow pointing upward.   Rub patch adhesive wings for 2 minutes.Remove white label marked "1".  Remove white label marked "2".  Rub patch adhesive wings for 2 additional minutes.   While looking in a mirror, press and release button in center of patch.  A small green light will flash 3-4 times .  This will be your only indicator the monitor has been turned on.     Do not shower for the first 24 hours.  You may shower after the first 24 hours.   Press button if you feel a symptom. You will hear a small click.  Record Date, Time and Symptom in the Patient Log  Book.   When you are ready to remove patch, follow instructions on last 2 pages of Patient Log Book.  Stick patch monitor onto last page of Patient Log Book.   Place Patient Log Book in [place] box.  Use locking tab on box and tape box closed securely.  The [employer] has [employer] on it.  Please place in mailbox as soon as possible.  Your physician should have your test results approximately 7 days after the monitor has been mailed back to [facility]   Call [place] at [phone] if you have questions regarding your ZIO XT patch monitor.  Call them immediately if you see an orange  light blinking on your monitor.   If your monitor falls off in less than 4 days contact our Monitor department at [phone].  If your monitor becomes loose or falls off after 4 days call [employer] at [phone] for suggestions on securing your monitor.

## 2020-07-29 LAB — BASIC METABOLIC PANEL
BUN/Creatinine Ratio: 13 (ref 10–24)
BUN: 30 mg/dL — ABNORMAL HIGH (ref 8–27)
CO2: 24 mmol/L (ref 20–29)
Calcium: 9.6 mg/dL (ref 8.6–10.2)
Chloride: 104 mmol/L (ref 96–106)
Creatinine, Ser: 2.29 mg/dL — ABNORMAL HIGH (ref 0.76–1.27)
Glucose: 93 mg/dL (ref 65–99)
Potassium: 5.2 mmol/L (ref 3.5–5.2)
Sodium: 143 mmol/L (ref 134–144)
eGFR: 31 mL/min/{1.73_m2} — ABNORMAL LOW (ref >59)

## 2020-07-29 LAB — MAGNESIUM: Magnesium: 2.2 mg/dL (ref 1.6–2.3)

## 2020-08-17 DIAGNOSIS — I493 Ventricular premature depolarization: Secondary | ICD-10-CM | POA: Diagnosis not present

## 2020-08-27 ENCOUNTER — Telehealth: Payer: Self-pay | Admitting: Physician Assistant

## 2020-08-27 NOTE — Telephone Encounter (Signed)
Returned call to patient who states he is having fluttering in chest on 2 occasions and on the 2nd occasion he checked his HR and it was 38 bpm. He has known history of frequent PVCs. He checked pulse with a BP cuff and with a pulse oximeter to verify. Has occasional lightheadedness and SOB. Current BP is 117/62, Hr 55 bpm per wrist cuff He is wearing a 14 day Zio monitor until May 9. He denies syncope, pre-syncope. Has continued to be active without limitation. Advised him to complete monitoring and place in mail on Monday. Gave him instructions on checking pulse manually and advised that home monitors may not give accurate HR due to his frequent PVCs. Advised him to call back if he has presyncope or other concerns and otherwise we will call him back once his monitor has been read. He verbalized understanding and agreement and thanked me for the call.

## 2020-08-27 NOTE — Telephone Encounter (Signed)
New Message:     Hear rate keep going up and dow.    Marland KitchenSTAT if HR is under 50 or over 120 (normal HR is 60-100 beats per minute)  1) What is your heart rate? Right now 27, now and been down today was low as 35  2) Do you have a log of your heart rate readings (document readings)?   3) Do you have any other symptoms? Short of breath, dizzy and no energy at times

## 2020-10-09 ENCOUNTER — Ambulatory Visit: Payer: Medicare Other | Admitting: Internal Medicine

## 2020-10-09 DIAGNOSIS — I493 Ventricular premature depolarization: Secondary | ICD-10-CM

## 2020-10-09 DIAGNOSIS — I639 Cerebral infarction, unspecified: Secondary | ICD-10-CM

## 2020-10-16 ENCOUNTER — Other Ambulatory Visit: Payer: Self-pay

## 2020-10-16 MED ORDER — FUROSEMIDE 20 MG PO TABS: 20.0000 mg | ORAL_TABLET | ORAL | 3 refills | Status: AC | PRN

## 2021-01-04 ENCOUNTER — Telehealth: Payer: Self-pay | Admitting: *Deleted

## 2021-01-04 NOTE — Telephone Encounter (Signed)
   [facility] Pre-operative Risk Assessment    Patient Name: Erik [name]  DOB: [birth year] MRN: [id number]  HEARTCARE STAFF:  - IMPORTANT!!!!!! Under Visit Info/Reason for Call, type in Other and utilize the format Clearance MM/DD/YY or Clearance TBD. Do not use dashes or single digits. - Please review there is not already an duplicate clearance open for this procedure. - If request is for dental extraction, please clarify the # of teeth to be extracted. - If the patient is currently at the dentist's office, call Pre-Op Callback Staff (MA/nurse) to input urgent request.  - If the patient is not currently in the dentist office, please route to the Pre-Op pool.  Request for surgical clearance:  What type of surgery is being performed?  COLONOSCOPY / EGD  When is this surgery scheduled?  01/13/21  What type of clearance is required (medical clearance vs. Pharmacy clearance to hold med vs. Both)?  BOTH  Are there any medications that need to be held prior to surgery and how long?  PLAVIX X'S 5 DATS  Practice name and name of physician performing surgery?  [facility] / DR. CENGIA   What is the office phone number?  [phone]   7.   What is the office fax number?  [phone]  8.   Anesthesia type (None, local, MAC, general) ?     Jeanann Lewandowsky 01/04/2021, 4:43 PM  _________________________________________________________________   (provider comments below)

## 2021-01-05 NOTE — Telephone Encounter (Signed)
Dr. [name] Pt has a history of CABG with 2/4 patent grafts on heart cath in 2017, and stroke.   From a cardiac perspective, may he hold plavix for upcoming colonoscopy/EGD? I will also have the requesting office reach to PCP for hx of stroke.   Left a VM for pt to return call to evaluate activity and symptoms

## 2021-01-05 NOTE — Telephone Encounter (Signed)
   Name: Erik [name]  DOB: Jun 21, 1955  MRN: [id number]   Primary Cardiologist: Jenkins Rouge, MD  Chart reviewed as part of pre-operative protocol coverage. Pt has a history of  CABG with 2/4 patent grafts on heart cath 2017. Pt is on plavix for CAD and for history of stroke.   Per Dr. [name]: OK to have colonoscopy.   Please reach out to PCP for plavix hold.   Therefore, based on ACC/AHA guidelines, the patient would be at acceptable risk for the planned procedure without further cardiovascular testing.    I will route this recommendation to the requesting party via Epic fax function and remove from pre-op pool. Please call with questions.  Tami Lin Lynnelle Mesmer, PA 01/05/2021, 1:46 PM

## 2021-01-05 NOTE — Telephone Encounter (Signed)
Follow Up:    Pt is returning  a call.

## 2021-02-05 ENCOUNTER — Ambulatory Visit (HOSPITAL_BASED_OUTPATIENT_CLINIC_OR_DEPARTMENT_OTHER): Payer: Medicare Other | Admitting: Family

## 2021-02-05 ENCOUNTER — Encounter (HOSPITAL_BASED_OUTPATIENT_CLINIC_OR_DEPARTMENT_OTHER): Payer: Self-pay | Admitting: Family

## 2021-02-05 ENCOUNTER — Other Ambulatory Visit: Payer: Self-pay

## 2021-02-05 VITALS — BP 124/78 | HR 67 | Ht 73.0 in | Wt 272.2 lb

## 2021-02-05 DIAGNOSIS — I10 Essential (primary) hypertension: Secondary | ICD-10-CM

## 2021-02-05 DIAGNOSIS — I25118 Atherosclerotic heart disease of native coronary artery with other forms of angina pectoris: Secondary | ICD-10-CM

## 2021-02-05 DIAGNOSIS — E785 Hyperlipidemia, unspecified: Secondary | ICD-10-CM

## 2021-02-05 DIAGNOSIS — Z8673 Personal history of transient ischemic attack (TIA), and cerebral infarction without residual deficits: Secondary | ICD-10-CM

## 2021-02-05 DIAGNOSIS — I493 Ventricular premature depolarization: Secondary | ICD-10-CM | POA: Diagnosis not present

## 2021-02-05 DIAGNOSIS — N183 Chronic kidney disease, stage 3 unspecified: Secondary | ICD-10-CM

## 2021-02-05 NOTE — Patient Instructions (Signed)
Medication Instructions:  Continue your current medications.   Recommend Prilosec as previously recommended by gastroenterology.  *If you need a refill on your cardiac medications before your next appointment, please call your pharmacy*  Lab Work: None ordered today.   Testing/Procedures: Your EKG today shows normal sinus rhythm with a first degree AV block which is a stable finding.   Follow-Up: At [facility], you and your health needs are our priority.  As part of our continuing mission to provide you with exceptional heart care, we have created designated Provider Care Teams.  These Care Teams include your primary Cardiologist (physician) and Advanced Practice Providers (APPs -  Physician Assistants and Nurse Practitioners) who all work together to provide you with the care you need, when you need it.  We recommend signing up for the patient portal called "MyChart".  Sign up information is provided on this After Visit Summary.  MyChart is used to connect with patients for Virtual Visits (Telemedicine).  Patients are able to view lab/test results, encounter notes, upcoming appointments, etc.  Non-urgent messages can be sent to your provider as well.   To learn more about what you can do with MyChart, go to [email or web address].    Your next appointment:   In March as scheduled with Dr. [name]   Other Instructions  Heart Healthy Diet Recommendations: A low-salt diet is recommended. Meats should be grilled, baked, or boiled. Avoid fried foods. Focus on lean protein sources like fish or chicken with vegetables and fruits. The American Heart Association is a Microbiologist!    Exercise recommendations: The American Heart Association recommends 150 minutes of moderate intensity exercise weekly. Try 30 minutes of moderate intensity exercise 4-5 times per week. This could include walking, jogging, or swimming.   Hiatal Hernia A hiatal hernia occurs when part of the stomach slides  above the muscle that separates the abdomen from the chest (diaphragm). A person can be born with a hiatal hernia (congenital), or it may develop over time. In almost all cases of hiatal hernia, only the top part of the stomach pushes through the diaphragm. Many people have a hiatal hernia with no symptoms. The larger the hernia, the more likely it is that you will have symptoms. In some cases, a hiatal hernia allows stomach acid to flow back into the tube that carries food from your mouth to your stomach (esophagus). This may cause heartburn symptoms. Severe heartburn symptoms may mean that you have developed a condition called gastroesophageal reflux disease (GERD). What are the causes? This condition is caused by a weakness in the opening (hiatus) where the esophagus passes through the diaphragm to attach to the upper part of the stomach. A person may be born with a weakness in the hiatus, or a weakness can develop over time. What increases the risk? This condition is more likely to develop in: Older people. Age is a major risk factor for a hiatal hernia, especially if you are over the age of 68. Pregnant women. People who are overweight. People who have frequent constipation. What are the signs or symptoms? Symptoms of this condition usually develop in the form of GERD symptoms. Symptoms include: Heartburn. Belching. Indigestion. Trouble swallowing. Coughing or wheezing. Sore throat. Hoarseness. Chest pain. Nausea and vomiting. How is this diagnosed? This condition may be diagnosed during testing for GERD. Tests that may be done include: X-rays of your stomach or chest. An upper gastrointestinal (GI) series. This is an X-ray exam of your GI tract that is  taken after you swallow a chalky liquid that shows up clearly on the X-ray. Endoscopy. This is a procedure to look into your stomach using a thin, flexible tube that has a tiny camera and light on the end of it. How is this  treated? This condition may be treated by: Dietary and lifestyle changes to help reduce GERD symptoms. Medicines. These may include: Over-the-counter antacids. Medicines that make your stomach empty more quickly. Medicines that block the production of stomach acid (H2 blockers). Stronger medicines to reduce stomach acid (proton pump inhibitors). Surgery to repair the hernia, if other treatments are not helping. If you have no symptoms, you may not need treatment. Follow these instructions at home: Lifestyle and activity Do not use any products that contain nicotine or tobacco, such as cigarettes and e-cigarettes. If you need help quitting, ask your health care provider. Try to achieve and maintain a healthy body weight. Avoid putting pressure on your abdomen. Anything that puts pressure on your abdomen increases the amount of acid that may be pushed up into your esophagus. Avoid bending over, especially after eating. Raise the head of your bed by putting blocks under the legs. This keeps your head and esophagus higher than your stomach. Do not wear tight clothing around your chest or stomach. Try not to strain when having a bowel movement, when urinating, or when lifting heavy objects. Eating and drinking Avoid foods that can worsen GERD symptoms. These may include: Fatty foods, like fried foods. Citrus fruits, like oranges or lemon. Other foods and drinks that contain acid, like orange juice or tomatoes. Spicy food. Chocolate. Eat frequent small meals instead of three large meals a day. This helps prevent your stomach from getting too full. Eat slowly. Do not lie down right after eating. Do not eat 1-2 hours before bed. Do not drink beverages with caffeine. These include cola, coffee, cocoa, and tea. Do not drink alcohol. General instructions Take over-the-counter and prescription medicines only as told by your health care provider. Keep all follow-up visits as told by your health  care provider. This is important. Contact a health care provider if: Your symptoms are not controlled with medicines or lifestyle changes. You are having trouble swallowing. You have coughing or wheezing that will not go away. Get help right away if: Your pain is getting worse. Your pain spreads to your arms, neck, jaw, teeth, or back. You have shortness of breath. You sweat for no reason. You feel sick to your stomach (nauseous) or you vomit. You vomit blood. You have bright red blood in your stools. You have black, tarry stools. Summary A hiatal hernia occurs when part of the stomach slides above the muscle that separates the abdomen from the chest (diaphragm). A person may be born with a weakness in the hiatus, or a weakness can develop over time. Symptoms of hiatal hernia may include heartburn, trouble swallowing, or sore throat. Management of hiatal hernia includes eating frequent small meals instead of three large meals a day. Get help right away if you vomit blood, have bright red blood in your stools, or have black, tarry stools. This information is not intended to replace advice given to you by your health care provider. Make sure you discuss any questions you have with your health care provider. Document Revised: 03/12/2020 Document Reviewed: 03/12/2020 Elsevier Patient Education  2022 [employer].

## 2021-02-05 NOTE — Progress Notes (Signed)
Office Visit    Patient Name: Erik [name] Date of Encounter: 02/06/2021  PCP:  Alfonso Ellis, NP   [place]  Cardiologist:  Jenkins Rouge, MD  Advanced Practice Provider:  No care team member to display Electrophysiologist:  None      Chief Complaint    Erik [name] is a 65 y.o. male with a hx of CAD s/p CABG in 2009 with cath 03/2016 with 2/4 patent grafts, 90% ostial diagonal prior to LAD occlusion recommended for medical therapy, HLD, ICM, hiatal hernia, CKD3, HTN presents today for follow up after ED visit for chest pain   Past Medical History    Past Medical History:  Diagnosis Date   Cancer [facility])    Renal cancer s/p Right nephrectomy   Chest pain 04/13/2016   CKD (chronic kidney disease) stage 3, GFR 30-59 ml/min ([place]) 09/01/2015   Coronary artery disease    Essential hypertension with goal blood pressure less than 130/85 09/01/2015   Hx of CABG    Hypertension    Renal disorder    Past Surgical History:  Procedure Laterality Date   CARDIAC CATHETERIZATION N/A 04/15/2016   Procedure: Left Heart Cath and Cors/Grafts Angiography;  Surgeon: Lorretta Harp, MD;  Location: [place] [facility];  Service: Cardiovascular;  Laterality: N/A;   CARDIAC SURGERY     LOOP RECORDER INSERTION N/A 06/28/2016   Procedure: Loop Recorder Insertion;  Surgeon: [name], MD;  Location: [place] [facility];  Service: Cardiovascular;  Laterality: N/A;   NEPHRECTOMY Right     Allergies  Allergies  Allergen Reactions   Rosuvastatin Other (See Comments)    Generalized pain   Zetia [Ezetimibe]     Feels bad all over     History of Present Illness    Erik [name] is a 65 y.o. male with a hx of CAD s/p CABG in 2009 with cath 03/2016 with 2/4 patent grafts, 90% ostial diagonal prior to LAD occlusion recommended for medical therapy, HLD, ICM, hiatal hernia, CKD3, HTN, CVA with ILR noting PVC, renal cancer s/p right nephrectomy last seen 07/28/20.  Prior  cryptogenic stroke with ILR showing PVCs. He was evaluated by Dr. Rayann Heman and PVC were too infrequent for ablation and patient preferred to avoid AAD. Recommendation was to continue Toprol and consider Mexiletine if it persisted.  He was last seen 07/28/20 in follow up noting elevated BP prior to meds but hypotension with dizzines later in the day. Also noted palpitations and as ILR at end of device life ZIO was ordered. Lasix was changed to PRN. Monitor showed predominantly NSR, 8 runs of VT longest 4 beats, and PVC burden 5.6%.   Seen in [facility] ED yesterday. Noted 2 week history of dyspnea. Noted eating or swallowing made him feel short of breath. Endoscopy 2 weeks ago per his report with 3 ulcers. HS troponin negative x2. EKG no acute changes.  He presents today for follow up. Notes longstanding history of trouble taking a deep breath. More notable during or after eating. He notes more trouble with deep breath when laying down. Tells me yesterday he was having lunch drinking sweet tea and felt sortness of breath coming on. No chest pain, but just a small amount of pressure. His friend looked at him and asked if he was okay. Continued with his lunch. Driving home felt lightheaded as if he might pass out. He pulled over and his friend came to take her to the ED at [facility].  He did take 2 days of Protonix and tells me he felt palpitations, shortness of breath. He has Prescription Prilosec but has not yet started. BP at home is well controlled which he attributes to being retired for nearly one year. Reports no exertional chest discomfort nor dyspnea.   EKGs/Labs/Other Studies Reviewed:   The following studies were reviewed today:  ZIO 07/2020 Patch Wear Time:  14 days and 0 hours (2022-04-25T16:57:56-0400 to 2022-05-09T16:58:00-0400)   Patient had a min HR of 52 bpm, max HR of 171 bpm, and avg HR of 74 bpm. Predominant underlying rhythm was Sinus Rhythm. 8 Ventricular Tachycardia runs occurred, the run  with the fastest interval lasting 4 beats with a max rate of 171 bpm, the longest  lasting 4 beats with an avg rate of 125 bpm. Isolated SVEs were rare (<1.0%), SVE Couplets were rare (<1.0%), and SVE Triplets were rare (<1.0%). Isolated VEs were frequent (5.6%, 83669), VE Couplets were occasional (1.5%, 10853), and VE Triplets were  rare (<1.0%, 603). Ventricular Bigeminy and Trigeminy were present.    Summary:  NSR average HR 74 bpm No significant sustained arrhythmias. NSVT longest 4 beats Couplets, Bigeminy rare   Echo 01/22/19 IMPRESSIONS     1. Left ventricular ejection fraction, by visual estimation, is 50 to  55%. The left ventricle has normal function. Normal left ventricular size.  There is no left ventricular hypertrophy.   2. Left ventricular diastolic Doppler parameters are consistent with  impaired relaxation pattern of LV diastolic filling.   3. Global right ventricle has normal systolic function.The right  ventricular size is mildly enlarged. No increase in right ventricular wall  thickness.   4. Left atrial size was mildly dilated.   5. Right atrial size was normal.   6. Mild mitral annular calcification.   7. The mitral valve is normal in structure. Trace mitral valve  regurgitation. No evidence of mitral stenosis.   8. The tricuspid valve is normal in structure. Tricuspid valve  regurgitation was not visualized by color flow Doppler.   9. The aortic valve is normal in structure. Aortic valve regurgitation  was not visualized by color flow Doppler. Structurally normal aortic  valve, with no evidence of sclerosis or stenosis.  10. The pulmonic valve was normal in structure. Pulmonic valve  regurgitation is mild by color flow Doppler.  11. Normal pulmonary artery systolic pressure.  12. The inferior vena cava is normal in size with greater than 50%  respiratory variability, suggesting right atrial pressure of 3 mmHg.   EKG:  EKG is ordered today.  The ekg ordered  today demonstrates NSR 67 bpm with first degree AV block 216 ms. Stable compared to previous.   Recent Labs: 07/28/2020: BUN 30; Creatinine, Ser 2.29; Magnesium 2.2; Potassium 5.2; Sodium 143  Recent Lipid Panel    Component Value Date/Time   CHOL 125 01/10/2019 1015   TRIG 144 01/10/2019 1015   HDL 48 01/10/2019 1015   CHOLHDL 2.6 01/10/2019 1015   CHOLHDL 5.4 04/15/2016 0339   VLDL 57 (H) 04/15/2016 0339   LDLCALC 52 01/10/2019 1015   LDLDIRECT 124 (H) 12/04/2017 1001    Home Medications   Current Meds  Medication Sig   acetaminophen (TYLENOL) 500 MG tablet Take 500-1,000 mg by mouth every 6 (six) hours as needed (for arthritic pain).   amLODipine (NORVASC) 10 MG tablet Take 10 mg by mouth as directed.   aspirin EC 81 MG tablet Take 1 tablet (81 mg total) by mouth daily.  Cholecalciferol (VITAMIN D3) 2000 units capsule Take 2,000 Units by mouth daily.   clopidogrel (PLAVIX) 75 MG tablet Take 75 mg by mouth daily.   Evolocumab (REPATHA SURECLICK) 332 MG/ML SOAJ Inject 1 pen into the skin every 14 (fourteen) days.   furosemide (LASIX) 20 MG tablet Take 1 tablet (20 mg total) by mouth as needed.   icosapent Ethyl (VASCEPA) 1 g capsule Take 2 capsules (2 g total) by mouth 2 (two) times daily.   isosorbide mononitrate (IMDUR) 30 MG 24 hr tablet Take 1 tablet (30 mg total) by mouth daily.   lisinopril (PRINIVIL,ZESTRIL) 5 MG tablet Take 1 tablet (5 mg total) by mouth daily.   magnesium oxide (MAG-OX) 400 MG tablet Take 400 mg by mouth daily.   metoprolol succinate (TOPROL-XL) 100 MG 24 hr tablet Take 100 mg by mouth daily.   nitroGLYCERIN (NITROSTAT) 0.4 MG SL tablet Place 1 tablet (0.4 mg total) under the tongue every 5 (five) minutes as needed for chest pain.   vitamin B-12 (CYANOCOBALAMIN) 250 MCG tablet Take 250 mcg by mouth daily.      Review of Systems      All other systems reviewed and are otherwise negative except as noted above.  Physical Exam    VS:  BP 124/78    Pulse 67   Ht 6\' 1"  (1.854 m)   Wt 272 lb 3.2 oz (123.5 kg)   SpO2 97%   BMI 35.91 kg/m  , BMI Body mass index is 35.91 kg/m.  Wt Readings from Last 3 Encounters:  02/05/21 272 lb 3.2 oz (123.5 kg)  07/28/20 271 lb (122.9 kg)  08/06/19 283 lb (128.4 kg)     GEN: Well nourished, overweight,  well developed, in no acute distress. HEENT: normal. Neck: Supple, no JVD, carotid bruits, or masses. Cardiac: RRR, no murmurs, rubs, or gallops. No clubbing, cyanosis, edema.  Radials/PT 2+ and equal bilaterally.  Respiratory:  Respirations regular and unlabored, clear to auscultation bilaterally. GI: Soft, nontender, nondistended. MS: No deformity or atrophy. Skin: Warm and dry, no rash. Neuro:  Strength and sensation are intact. Psych: Normal affect.  Assessment & Plan    CAD - EKG today NSR with stable first degree AV block and no acute ST/T wave change. No exertional chest pain. Episode of chest pain yesterday likely due to hiatal hernia. No indication for ischemic evaluation at this time. GDMT includes aspirin, plavix, metoprolol, repatha, imdur. Future consideration include escalation of imdur dose if he notes exertional chest discomfort. Heart healthy diet and regular cardiovascular exercise encouraged.    HTN - BP well controlled. Continue current antihypertensive regimen.    Hiatal hernia - Likely cause of chest discomfort as it occurs after meals and sensation of not getting deep breath. Did not tolerate Protonix due to palpitation but has been prescribed Prilosec by GI to try. Encouraged to start. Educational handout on hiatal hernia provided.   CKD3 - s/p nephrectomy. Careful titration of diuretic and antihypertensive.    History of CVA - Continue aspirin, plavix, statin. Previous ILR with no evidence of atrial fib.   HLD - 06/24/20 LDL 60. Statin intolerant. Continue Repatha and Vascepa.   PVC - None noted by EKG today. Reports occasional palpitations. Discussed options of PRN  Lopressor or seeing EP again and he prefers to continue to monitor symptoms at home.   Disposition: Follow up  06/2021  with Dr. [name] as scheduled  Signed, Loel Dubonnet, NP 02/06/2021, 8:19 AM [facility]  

## 2021-03-04 ENCOUNTER — Telehealth (HOSPITAL_BASED_OUTPATIENT_CLINIC_OR_DEPARTMENT_OTHER): Payer: Self-pay | Admitting: Family

## 2021-03-04 NOTE — Telephone Encounter (Signed)
   [facility] Pre-operative Risk Assessment    Patient Name: Erik [name]  DOB: Apr 17, 1956 MRN: [id number]  HEARTCARE STAFF:  - IMPORTANT!!!!!! Under Visit Info/Reason for Call, type in Other and utilize the format Clearance MM/DD/YY or Clearance TBD. Do not use dashes or single digits. - Please review there is not already an duplicate clearance open for this procedure. - If request is for dental extraction, please clarify the # of teeth to be extracted. - If the patient is currently at the dentist's office, call Pre-Op Callback Staff (MA/nurse) to input urgent request.  - If the patient is not currently in the dentist office, please route to the Pre-Op pool.  Request for surgical clearance:  What type of surgery is being performed? Upper Endoscopy  When is this surgery scheduled? 03/10/21  What type of clearance is required (medical clearance vs. Pharmacy clearance to hold med vs. Both)? Pharmacy  Are there any medications that need to be held prior to surgery and how long? Plavix 5 days  Practice name and name of physician performing surgery? Dr. Marcy Salvo, [facility]  What is the office phone number? [phone]   7.   What is the office fax number? [phone]  8.   Anesthesia type (None, local, MAC, general) ? Patient is not sure    Johnna Acosta 03/04/2021, 11:00 AM  _________________________________________________________________   (provider comments below)

## 2021-03-04 NOTE — Telephone Encounter (Signed)
    Patient Name: Erik [name]  DOB: 14-Sep-1955 MRN: [id number]  Primary Cardiologist: Jenkins Rouge, MD  Chart reviewed as part of pre-operative protocol coverage.   Hx of CAD s/p CABG in 2009 with cath 03/2016 with 2/4 patent grafts, 90% ostial diagonal prior to LAD occlusion recommended for medical therapy. Also hx of CVA. On DAPT with ASA and Plavix.   Recent chest pain episode felt due to hiatal hernia per OV note 02/05/21.   Dr.Nishan, Can patient hold Plavix for 5 days? Please forward your response to [facility] PREOP.   Thank you    [name], PA 03/04/2021, 11:08 AM

## 2021-06-20 NOTE — Progress Notes (Signed)
? ?Office Visit  ?  ?Patient Name: Erik [name] ?Date of Encounter: 06/24/2021 ? ?PCP:  Alfonso Ellis, NP ?  ?Grandfather  ?Cardiologist:  Jenkins Rouge, MD  ?   ? ?Chief Complaint  ?  ?Erik [name] is a 66 y.o. male with a hx of CAD s/p CABG in 2009 with cath 03/2016 with 2/4 patent grafts, 90% ostial diagonal prior to LAD occlusion recommended for medical therapy, HLD, ICM, hiatal hernia, CKD3 post nephrectomy Cr 2.4 , and HTN  ? ?Past Medical History  ?  ?Past Medical History:  ?Diagnosis Date  ? Cancer [facility])   ? Renal cancer s/p Right nephrectomy  ? Chest pain 04/13/2016  ? CKD (chronic kidney disease) stage 3, GFR 30-59 ml/min (HCC) 09/01/2015  ? Coronary artery disease   ? Essential hypertension with goal blood pressure less than 130/85 09/01/2015  ? Hx of CABG   ? Hypertension   ? Renal disorder   ? ?Past Surgical History:  ?Procedure Laterality Date  ? CARDIAC CATHETERIZATION N/A 04/15/2016  ? Procedure: Left Heart Cath and Cors/Grafts Angiography;  Surgeon: Lorretta Harp, MD;  Location: [facility];  Service: Cardiovascular;  Laterality: N/A;  ? CARDIAC SURGERY    ? LOOP RECORDER INSERTION N/A 06/28/2016  ? Procedure: Loop Recorder Insertion;  Surgeon: [name], MD;  Location: [facility];  Service: Cardiovascular;  Laterality: N/A;  ? NEPHRECTOMY Right   ? ? ?Allergies ? ?Allergies  ?Allergen Reactions  ? Rosuvastatin Other (See Comments)  ?  Generalized pain  ? Zetia [Ezetimibe]   ?  Feels bad all over   ? ? ?History of Present Illness  ?  ?66 y.o. with above history Also cryptogenic stroke with ILR showing PVCs. He was evaluated by Dr. Rayann Heman and PVC were too infrequent for ablation and patient preferred to avoid AAD. Recommendation was to continue Toprol and consider Mexiletine if it persisted. ? ?ILR EOL  Monitor 09/09/20 showed predominantly NSR, 8 runs of VT longest 4 beats, and PVC burden 5.6%.  ? ?Seen in [facility] ED 02/04/21.Had PUD with EGD 2 weeks before this  and chest pain/symptoms thought due to this HS troponin negative x2. EKG no acute changes. ? ?He has had multiple EGDls with [facility]. 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 Ok to hold plavix 5 days before and 5 days after No angina Chronic palpitations from [facility]  Monitor 09/09/20 with PVC;s no NSVT <1% total beats so Rx beta blocker not frequent enough to consider AAT/Ablation  ? ?EKGs/Labs/Other Studies Reviewed:  ? ?The following studies were reviewed today: ? ?ZIO 09/09/20  ?Summary:  NSR average HR 74 bpm No significant sustained arrhythmias. NSVT longest 4 beats Couplets, Bigeminy rare  ?ZIO 07/2020  ?Summary:  NSR average HR 74 bpm No significant sustained arrhythmias. NSVT longest 4 beats Couplets, Bigeminy rare  ? ?Echo 01/22/19 ?IMPRESSIONS  ? ? ? 1. Left ventricular ejection fraction, by visual estimation, is 50 to  ?55%. The left ventricle has normal function. Normal left ventricular size.  ?There is no left ventricular hypertrophy.  ? 2. Left ventricular diastolic Doppler parameters are consistent with  ?impaired relaxation pattern of LV diastolic filling.  ? 3. Global right ventricle has normal systolic function.The right  ?ventricular size is mildly enlarged. No increase in right ventricular wall  ?thickness.  ? 4. Left atrial size was mildly dilated.  ? 5. Right atrial size was normal.  ? 6.  Mild mitral annular calcification.  ? 7. The mitral valve is normal in structure. Trace mitral valve  ?regurgitation. No evidence of mitral stenosis.  ? 8. The tricuspid valve is normal in structure. Tricuspid valve  ?regurgitation was not visualized by color flow Doppler.  ? 9. The aortic valve is normal in structure. Aortic valve regurgitation  ?was not visualized by color flow Doppler. Structurally normal aortic  ?valve, with no evidence of sclerosis or stenosis.  ?10. The pulmonic valve was normal in structure. Pulmonic valve  ?regurgitation is mild by color flow Doppler.  ?11. Normal  pulmonary artery systolic pressure.  ?12. The inferior vena cava is normal in size with greater than 50%  ?respiratory variability, suggesting right atrial pressure of 3 mmHg.  ? ?EKG:   SR rate 67 PR 215 msec 02/05/21  ? ?Recent Labs: ?07/28/2020: BUN 30; Creatinine, Ser 2.29; Magnesium 2.2; Potassium 5.2; Sodium 143  ?Recent Lipid Panel ?   ?Component Value Date/Time  ? CHOL 125 01/10/2019 1015  ? TRIG 144 01/10/2019 1015  ? HDL 48 01/10/2019 1015  ? CHOLHDL 2.6 01/10/2019 1015  ? CHOLHDL 5.4 04/15/2016 0339  ? VLDL 57 (H) 04/15/2016 0339  ? [place] 52 01/10/2019 1015  ? LDLDIRECT 124 (H) 12/04/2017 1001  ? ? ?Home Medications  ? ?No outpatient medications have been marked as taking for the 06/24/21 encounter (Appointment) with [name], MD.  ?  ? ?Review of Systems  ?    ?All other systems reviewed and are otherwise negative except as noted above. ? ?Physical Exam  ?  ?VS:  There were no vitals taken for this visit. , BMI There is no height or weight on file to calculate BMI. ? ?Wt Readings from Last 3 Encounters:  ?02/05/21 272 lb 3.2 oz (123.5 kg)  ?07/28/20 271 lb (122.9 kg)  ?08/06/19 283 lb (128.4 kg)  ?  ?Affect appropriate ?Healthy:  appears stated age ?HEENT: normal ?Neck supple with no adenopathy ?JVP normal no bruits no thyromegaly ?Lungs clear with no wheezing and good diaphragmatic motion ?Heart:  S1/S2 no murmur, no rub, gallop or click ?PMI normal Post sternotomy  ?Abdomen: benighn, post nephrectomy right ?Distal pulses intact with no bruits ?No edema ?Neuro non-focal ?Skin warm and dry ?No muscular weakness ? ? ?Assessment & Plan  ?  ?CAD - CABG 2009 Cath 2017 medical Rx GDMT includes aspirin, plavix, metoprolol, repatha, imdur.  ? ?HTN - BP well controlled. Continue current antihypertensive regimen.   ? ?Hiatal hernia - With PUD Did not tolerate Protonix due to palpitation but has been prescribed Prilosec by GI to try. Encouraged to start. Educational handout on hiatal hernia provided.  ? ?CKD3  - s/p nephrectomy. Careful titration of diuretic and an ? ?5. GI:  history of ulcers healed with H2 blocker. Latter this month will have sub mucosal carcinoid removed Hold plavix 5 days before and after  ? ?6. PVC;s;  continue beta blocker may need to repeat monitor in future If he becomes more symptomatic  ? ? ?Jenkins Rouge MD [facility] ? ? ? ?

## 2021-06-24 ENCOUNTER — Ambulatory Visit: Payer: Medicare Other | Admitting: Cardiovascular Disease

## 2021-06-24 ENCOUNTER — Other Ambulatory Visit: Payer: Self-pay

## 2021-06-24 ENCOUNTER — Encounter: Payer: Self-pay | Admitting: Cardiovascular Disease

## 2021-06-24 VITALS — BP 126/72 | HR 75 | Ht 73.0 in | Wt 266.0 lb

## 2021-06-24 DIAGNOSIS — R002 Palpitations: Secondary | ICD-10-CM | POA: Diagnosis not present

## 2021-06-24 DIAGNOSIS — I25118 Atherosclerotic heart disease of native coronary artery with other forms of angina pectoris: Secondary | ICD-10-CM

## 2021-06-24 NOTE — Patient Instructions (Addendum)
Medication Instructions:  °Your physician recommends that you continue on your current medications as directed. Please refer to the Current Medication list given to you today. ° °*If you need a refill on your cardiac medications before your next appointment, please call your pharmacy* ° °Lab Work: °If you have labs (blood work) drawn today and your tests are completely normal, you will receive your results only by: °MyChart Message (if you have MyChart) OR °A paper copy in the mail °If you have any lab test that is abnormal or we need to change your treatment, we will call you to review the results. ° °Testing/Procedures: °None ordered today. ° °Follow-Up: °At [facility], you and your health needs are our priority.  As part of our continuing mission to provide you with exceptional heart care, we have created designated Provider Care Teams.  These Care Teams include your primary Cardiologist (physician) and Advanced Practice Providers (APPs -  Physician Assistants and Nurse Practitioners) who all work together to provide you with the care you need, when you need it. ° °We recommend signing up for the patient portal called "MyChart".  Sign up information is provided on this After Visit Summary.  MyChart is used to connect with patients for Virtual Visits (Telemedicine).  Patients are able to view lab/test results, encounter notes, upcoming appointments, etc.  Non-urgent messages can be sent to your provider as well.   °To learn more about what you can do with MyChart, go to [email or web address].   ° °Your next appointment:   °6 month(s) ° °The format for your next appointment:   °In Person ° °Provider:   °Peter Nishan, MD { ° ° °

## 2021-07-01 DIAGNOSIS — M47816 Spondylosis without myelopathy or radiculopathy, lumbar region: Secondary | ICD-10-CM | POA: Diagnosis not present

## 2021-07-01 DIAGNOSIS — M5459 Other low back pain: Secondary | ICD-10-CM | POA: Diagnosis not present

## 2021-07-06 DIAGNOSIS — I25810 Atherosclerosis of coronary artery bypass graft(s) without angina pectoris: Secondary | ICD-10-CM | POA: Diagnosis not present

## 2021-07-06 DIAGNOSIS — I10 Essential (primary) hypertension: Secondary | ICD-10-CM | POA: Diagnosis not present

## 2021-07-06 DIAGNOSIS — R911 Solitary pulmonary nodule: Secondary | ICD-10-CM | POA: Diagnosis not present

## 2021-07-06 DIAGNOSIS — D3A8 Other benign neuroendocrine tumors: Secondary | ICD-10-CM | POA: Diagnosis not present

## 2021-07-06 DIAGNOSIS — Z5181 Encounter for therapeutic drug level monitoring: Secondary | ICD-10-CM | POA: Diagnosis not present

## 2021-07-06 DIAGNOSIS — Z85528 Personal history of other malignant neoplasm of kidney: Secondary | ICD-10-CM | POA: Diagnosis not present

## 2021-07-07 DIAGNOSIS — R918 Other nonspecific abnormal finding of lung field: Secondary | ICD-10-CM | POA: Diagnosis not present

## 2021-07-07 DIAGNOSIS — I2510 Atherosclerotic heart disease of native coronary artery without angina pectoris: Secondary | ICD-10-CM | POA: Diagnosis not present

## 2021-07-07 DIAGNOSIS — R911 Solitary pulmonary nodule: Secondary | ICD-10-CM | POA: Diagnosis not present

## 2021-07-07 DIAGNOSIS — Z85528 Personal history of other malignant neoplasm of kidney: Secondary | ICD-10-CM | POA: Diagnosis not present

## 2021-07-07 DIAGNOSIS — D3A8 Other benign neuroendocrine tumors: Secondary | ICD-10-CM | POA: Diagnosis not present

## 2021-07-12 DIAGNOSIS — D490 Neoplasm of unspecified behavior of digestive system: Secondary | ICD-10-CM | POA: Diagnosis not present

## 2021-07-12 DIAGNOSIS — K279 Peptic ulcer, site unspecified, unspecified as acute or chronic, without hemorrhage or perforation: Secondary | ICD-10-CM | POA: Diagnosis not present

## 2021-07-12 DIAGNOSIS — I10 Essential (primary) hypertension: Secondary | ICD-10-CM | POA: Diagnosis not present

## 2021-07-14 DIAGNOSIS — D490 Neoplasm of unspecified behavior of digestive system: Secondary | ICD-10-CM | POA: Diagnosis not present

## 2021-07-14 DIAGNOSIS — D3A8 Other benign neuroendocrine tumors: Secondary | ICD-10-CM | POA: Diagnosis not present

## 2021-07-14 DIAGNOSIS — I10 Essential (primary) hypertension: Secondary | ICD-10-CM | POA: Diagnosis not present

## 2021-07-23 DIAGNOSIS — Z905 Acquired absence of kidney: Secondary | ICD-10-CM | POA: Diagnosis not present

## 2021-07-23 DIAGNOSIS — N281 Cyst of kidney, acquired: Secondary | ICD-10-CM | POA: Diagnosis not present

## 2021-07-23 DIAGNOSIS — D3A8 Other benign neuroendocrine tumors: Secondary | ICD-10-CM | POA: Diagnosis not present

## 2021-07-27 DIAGNOSIS — M7522 Bicipital tendinitis, left shoulder: Secondary | ICD-10-CM | POA: Diagnosis not present

## 2021-07-27 DIAGNOSIS — D3A8 Other benign neuroendocrine tumors: Secondary | ICD-10-CM | POA: Diagnosis not present

## 2021-07-27 DIAGNOSIS — I10 Essential (primary) hypertension: Secondary | ICD-10-CM | POA: Diagnosis not present

## 2021-08-11 DIAGNOSIS — Z79899 Other long term (current) drug therapy: Secondary | ICD-10-CM | POA: Diagnosis not present

## 2021-08-11 DIAGNOSIS — K449 Diaphragmatic hernia without obstruction or gangrene: Secondary | ICD-10-CM | POA: Diagnosis not present

## 2021-08-11 DIAGNOSIS — Z951 Presence of aortocoronary bypass graft: Secondary | ICD-10-CM | POA: Diagnosis not present

## 2021-08-11 DIAGNOSIS — Z905 Acquired absence of kidney: Secondary | ICD-10-CM | POA: Diagnosis not present

## 2021-08-11 DIAGNOSIS — C7A1 Malignant poorly differentiated neuroendocrine tumors: Secondary | ICD-10-CM | POA: Diagnosis not present

## 2021-08-11 DIAGNOSIS — Z7982 Long term (current) use of aspirin: Secondary | ICD-10-CM | POA: Diagnosis not present

## 2021-08-11 DIAGNOSIS — Z6835 Body mass index (BMI) 35.0-35.9, adult: Secondary | ICD-10-CM | POA: Diagnosis not present

## 2021-08-11 DIAGNOSIS — K317 Polyp of stomach and duodenum: Secondary | ICD-10-CM | POA: Diagnosis not present

## 2021-08-11 DIAGNOSIS — Z85520 Personal history of malignant carcinoid tumor of kidney: Secondary | ICD-10-CM | POA: Diagnosis not present

## 2021-08-11 DIAGNOSIS — Z8673 Personal history of transient ischemic attack (TIA), and cerebral infarction without residual deficits: Secondary | ICD-10-CM | POA: Diagnosis not present

## 2021-08-11 DIAGNOSIS — N1832 Chronic kidney disease, stage 3b: Secondary | ICD-10-CM | POA: Diagnosis not present

## 2021-08-11 DIAGNOSIS — E669 Obesity, unspecified: Secondary | ICD-10-CM | POA: Diagnosis not present

## 2021-08-11 DIAGNOSIS — E7849 Other hyperlipidemia: Secondary | ICD-10-CM | POA: Diagnosis not present

## 2021-08-11 DIAGNOSIS — Z888 Allergy status to other drugs, medicaments and biological substances status: Secondary | ICD-10-CM | POA: Diagnosis not present

## 2021-08-11 DIAGNOSIS — D3A8 Other benign neuroendocrine tumors: Secondary | ICD-10-CM | POA: Diagnosis not present

## 2021-08-11 DIAGNOSIS — I2510 Atherosclerotic heart disease of native coronary artery without angina pectoris: Secondary | ICD-10-CM | POA: Diagnosis not present

## 2021-08-11 DIAGNOSIS — I129 Hypertensive chronic kidney disease with stage 1 through stage 4 chronic kidney disease, or unspecified chronic kidney disease: Secondary | ICD-10-CM | POA: Diagnosis not present

## 2021-08-16 DIAGNOSIS — I10 Essential (primary) hypertension: Secondary | ICD-10-CM | POA: Diagnosis not present

## 2021-08-16 DIAGNOSIS — N184 Chronic kidney disease, stage 4 (severe): Secondary | ICD-10-CM | POA: Diagnosis not present

## 2021-08-16 DIAGNOSIS — M48061 Spinal stenosis, lumbar region without neurogenic claudication: Secondary | ICD-10-CM | POA: Diagnosis not present

## 2021-08-16 DIAGNOSIS — M5136 Other intervertebral disc degeneration, lumbar region: Secondary | ICD-10-CM | POA: Diagnosis not present

## 2021-08-17 ENCOUNTER — Telehealth: Payer: Self-pay | Admitting: *Deleted

## 2021-08-17 DIAGNOSIS — D3A8 Other benign neuroendocrine tumors: Secondary | ICD-10-CM | POA: Diagnosis not present

## 2021-08-17 DIAGNOSIS — I10 Essential (primary) hypertension: Secondary | ICD-10-CM | POA: Diagnosis not present

## 2021-08-17 DIAGNOSIS — M7522 Bicipital tendinitis, left shoulder: Secondary | ICD-10-CM | POA: Diagnosis not present

## 2021-08-17 NOTE — Telephone Encounter (Signed)
? ?  Pre-operative Risk Assessment  ?  ?Patient Name: Erik [name]  ?DOB: 07/27/1955 ?MRN: [id number]  ? ?  ? ?Request for Surgical Clearance   ? ?Procedure:   L5-S1 INTERLAMINAR ESI ? ?Date of Surgery:  Clearance 09/29/21                              ?   ?Surgeon:  DR. O'TOOLE ?Surgeon's Group or Practice Name:  [facility] ?Phone number:  [phone] ?Fax number:  [phone] ?  ?Type of Clearance Requested:   ?- Medical  ?- Pharmacy:  Hold Clopidogrel (Plavix) x 7 DAYS PRIOR ?  ?Type of Anesthesia:  Not Indicated ?  ?Additional requests/questions:   ? ?Signed, ?Julaine Hua   ?08/17/2021, 5:42 PM  ? ?

## 2021-08-18 NOTE — Telephone Encounter (Signed)
? ? ?  Patient Name: Erik [name]  ?DOB: 04-19-1956 ?MRN: [id number] ? ?Primary Cardiologist: Jenkins Rouge, MD ? ?Chart reviewed as part of pre-operative protocol coverage.  ? ?Per office visit note 06/24/21 "He has had multiple EGDls with [facility]. 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 Ok to hold plavix 5 days before and 5 days after" ? ?We got request to hold Plavix for 7 days prior for L5-S1 INTERLAMINAR ESI. Can patient hold? ? ?Please forward your response to P CV DIV PREOP.  ? ?Thank you  ? ?[name], PA ?08/18/2021, 9:22 AM  ?

## 2021-08-18 NOTE — Telephone Encounter (Signed)
Per Dr. [name] "Ok to hold plavix for 7 days " ?

## 2021-09-28 DIAGNOSIS — M25562 Pain in left knee: Secondary | ICD-10-CM | POA: Insufficient documentation

## 2021-09-28 DIAGNOSIS — Z79899 Other long term (current) drug therapy: Secondary | ICD-10-CM | POA: Diagnosis not present

## 2021-09-28 DIAGNOSIS — I2510 Atherosclerotic heart disease of native coronary artery without angina pectoris: Secondary | ICD-10-CM | POA: Diagnosis not present

## 2021-09-28 DIAGNOSIS — M5442 Lumbago with sciatica, left side: Secondary | ICD-10-CM | POA: Diagnosis not present

## 2021-09-28 DIAGNOSIS — R52 Pain, unspecified: Secondary | ICD-10-CM | POA: Diagnosis not present

## 2021-09-28 DIAGNOSIS — M5432 Sciatica, left side: Secondary | ICD-10-CM | POA: Diagnosis not present

## 2021-09-28 DIAGNOSIS — M79605 Pain in left leg: Secondary | ICD-10-CM | POA: Diagnosis not present

## 2021-09-28 DIAGNOSIS — Z7982 Long term (current) use of aspirin: Secondary | ICD-10-CM | POA: Diagnosis not present

## 2021-09-28 DIAGNOSIS — Z7902 Long term (current) use of antithrombotics/antiplatelets: Secondary | ICD-10-CM | POA: Diagnosis not present

## 2021-09-28 DIAGNOSIS — I129 Hypertensive chronic kidney disease with stage 1 through stage 4 chronic kidney disease, or unspecified chronic kidney disease: Secondary | ICD-10-CM | POA: Diagnosis not present

## 2021-09-28 DIAGNOSIS — N189 Chronic kidney disease, unspecified: Secondary | ICD-10-CM | POA: Diagnosis not present

## 2021-09-29 ENCOUNTER — Emergency Department (HOSPITAL_COMMUNITY)
Admission: EM | Admit: 2021-09-29 | Discharge: 2021-09-29 | Disposition: A | Discharge status: Home / Self Care | Payer: Medicare Other | Attending: Emergency Medicine | Admitting: Emergency Medicine

## 2021-09-29 ENCOUNTER — Other Ambulatory Visit: Payer: Self-pay

## 2021-09-29 ENCOUNTER — Emergency Department (HOSPITAL_COMMUNITY): Payer: Medicare Other

## 2021-09-29 ENCOUNTER — Encounter (HOSPITAL_COMMUNITY): Payer: Self-pay | Admitting: Emergency Medicine

## 2021-09-29 DIAGNOSIS — M48061 Spinal stenosis, lumbar region without neurogenic claudication: Secondary | ICD-10-CM | POA: Diagnosis not present

## 2021-09-29 DIAGNOSIS — M25562 Pain in left knee: Secondary | ICD-10-CM | POA: Diagnosis not present

## 2021-09-29 DIAGNOSIS — M5136 Other intervertebral disc degeneration, lumbar region: Secondary | ICD-10-CM | POA: Diagnosis not present

## 2021-09-29 DIAGNOSIS — M5432 Sciatica, left side: Secondary | ICD-10-CM

## 2021-09-29 DIAGNOSIS — M4726 Other spondylosis with radiculopathy, lumbar region: Secondary | ICD-10-CM | POA: Diagnosis not present

## 2021-09-29 MED ORDER — METHOCARBAMOL 1000 MG/10ML IJ SOLN
1000.0000 mg | Freq: Once | INTRAMUSCULAR | Status: AC
  Administered 2021-09-29: 1000 mg via INTRAVENOUS
  Filled 2021-09-29: qty 10

## 2021-09-29 MED ORDER — OXYCODONE-ACETAMINOPHEN 5-325 MG PO TABS
1.0000 | ORAL_TABLET | Freq: Once | ORAL | Status: AC
  Administered 2021-09-29: 1 via ORAL
  Filled 2021-09-29: qty 1

## 2021-09-29 MED ORDER — OXYCODONE-ACETAMINOPHEN 5-325 MG PO TABS: 1.0000 | ORAL_TABLET | Freq: Four times a day (QID) | ORAL | 0 refills | Status: DC | PRN

## 2021-09-29 MED ORDER — ONDANSETRON HCL 4 MG/2ML IJ SOLN
4.0000 mg | Freq: Once | INTRAMUSCULAR | Status: AC
  Administered 2021-09-29: 4 mg via INTRAVENOUS
  Filled 2021-09-29: qty 2

## 2021-09-29 MED ORDER — HYDROMORPHONE HCL 1 MG/ML IJ SOLN
1.0000 mg | Freq: Once | INTRAMUSCULAR | Status: AC
  Administered 2021-09-29: 1 mg via INTRAVENOUS
  Filled 2021-09-29: qty 1

## 2021-09-29 MED ORDER — METHOCARBAMOL 1000 MG/10ML IJ SOLN
INTRAMUSCULAR | Status: AC
  Filled 2021-09-29: qty 10

## 2021-09-29 MED ORDER — METHOCARBAMOL 500 MG PO TABS: 500.0000 mg | ORAL_TABLET | Freq: Four times a day (QID) | ORAL | 0 refills | Status: DC | PRN

## 2021-09-29 MED ORDER — METHOCARBAMOL 500 MG PO TABS
1000.0000 mg | ORAL_TABLET | Freq: Once | ORAL | Status: AC
  Administered 2021-09-29: 1000 mg via ORAL
  Filled 2021-09-29: qty 2

## 2021-09-29 NOTE — ED Notes (Signed)
Pt to xr 

## 2021-09-29 NOTE — Discharge Instructions (Signed)
The cause for your knee pain is not clear, but your leg pain seems to be from sciatica, which is from an irritated nerve in your back.  You can ask your primary care provider to refer you to an orthopedic physician, if necessary.  Please apply ice to sore areas as needed.  Ice to be applied for 30 minutes at a time, 4 times a day.  Please keep your appointment today for epidural injection.  It may help this pain.  Return if you are having any problems.

## 2021-09-29 NOTE — ED Provider Notes (Signed)
[facility] EMERGENCY DEPARTMENT Provider Note   CSN: [id number] Arrival date & time: 09/28/21  2353     History  Chief Complaint  Patient presents with   Leg Pain    Erik [name] is a 66 y.o. male.  The history is provided by the patient.  Leg Pain He has history of hypertension, hyperlipidemia, coronary artery disease, chronic kidney disease, and comes in because of pain in his left leg which started while he was driving.  Pain seems to start in the left knee and goes down towards his ankle and up towards his hip.  It is so severe that he is having difficulty bearing weight on that leg.  He denies any trauma.  He is scheduled for an epidural steroid injection in the lumbar spine because of his spinal stenosis.  He states his pain is so severe that he cannot tell if his leg is numb or not.   Home Medications Prior to Admission medications   Medication Sig Start Date End Date Taking? Authorizing Provider  acetaminophen (TYLENOL) 500 MG tablet Take 500-1,000 mg by mouth every 6 (six) hours as needed (for arthritic pain).    [provider]  amLODipine (NORVASC) 10 MG tablet Take 10 mg by mouth as directed.    [provider]  aspirin EC 81 MG tablet Take 1 tablet (81 mg total) by mouth daily. 10/30/17   Richardson Dopp T, PA-C  Cholecalciferol (VITAMIN D3) 2000 units capsule Take 2,000 Units by mouth daily.    [provider]  clopidogrel (PLAVIX) 75 MG tablet Take 75 mg by mouth daily. 07/31/15   [provider]  clotrimazole-betamethasone (LOTRISONE) cream Apply 1 application topically 2 (two) times daily. 04/13/21   [provider]  Evolocumab (REPATHA SURECLICK) 683 MG/ML SOAJ Inject 1 pen into the skin every 14 (fourteen) days. 07/07/20   [name], MD  famotidine (PEPCID) 40 MG tablet Take 1 tablet by mouth 2 (two) times daily. 04/27/21   [provider]  fenofibrate 54 MG tablet Take 54 mg by mouth daily. 04/29/21   [provider]  furosemide (LASIX) 20 MG tablet Take 1 tablet (20 mg total) by mouth as needed. 10/16/20   Imogene Burn, PA-C  icosapent Ethyl (VASCEPA) 1 g capsule Take 2 capsules (2 g total) by mouth 2 (two) times daily. 04/02/20   [name], MD  isosorbide mononitrate (IMDUR) 30 MG 24 hr tablet Take 1 tablet (30 mg total) by mouth daily. 10/10/18   [name], MD  lisinopril (PRINIVIL,ZESTRIL) 5 MG tablet Take 1 tablet (5 mg total) by mouth daily. 04/16/16   Rai, Ripudeep K, MD  magnesium oxide (MAG-OX) 400 MG tablet Take 400 mg by mouth daily. Patient not taking: Reported on 06/24/2021    [provider]  metoprolol succinate (TOPROL-XL) 100 MG 24 hr tablet Take 100 mg by mouth daily. 06/02/16   [provider]  nitroGLYCERIN (NITROSTAT) 0.4 MG SL tablet Place 1 tablet (0.4 mg total) under the tongue every 5 (five) minutes as needed for chest pain. 01/10/19   [name], MD  vitamin B-12 (CYANOCOBALAMIN) 250 MCG tablet Take 250 mcg by mouth daily.     [provider]      Allergies    Rosuvastatin and Zetia [ezetimibe]    Review of Systems   Review of Systems  All other systems reviewed and are negative.  Physical Exam Updated Vital Signs BP 128/71   Pulse  86   Temp 98.2 F (36.8 C)   Resp 18   Ht '5\' 11"'$  (1.803 m)   Wt 119.3 kg   SpO2 98%   BMI 36.68 kg/m  Physical Exam Vitals and nursing note reviewed.  66 year old male, appears uncomfortable, but is in no acute distress. Vital signs are normal. Oxygen saturation is 98%, which is normal. Head is normocephalic and atraumatic. PERRLA, EOMI. Oropharynx is clear. Neck is nontender and supple without adenopathy or JVD. Back is nontender and there is no CVA tenderness.  Straight leg raise is positive on the left at 30 degrees, negative on the right. Lungs are clear without rales, wheezes, or rhonchi. Chest is nontender. Heart has regular rate and rhythm without murmur. Abdomen is soft,  flat, nontender without masses or hepatosplenomegaly and peristalsis is normoactive. Extremities: There is no swelling or deformity noted.  There is mild tenderness to palpation in the left knee, but there is no effusion present and there is no instability.  He complains of pain with passive range of motion, but full range of motion is present in both the left hip and left knee.  Calf and thigh circumferences are symmetric. Skin is warm and dry without rash. Neurologic: Mental status is normal, cranial nerves are intact.  ED Results / Procedures / Treatments    Radiology DG Knee Complete 4 Views Left  Result Date: 09/29/2021 CLINICAL DATA:  Left knee pain since yesterday, atraumatic EXAM: LEFT KNEE - COMPLETE 4+ VIEW COMPARISON:  None Available. FINDINGS: No evidence of fracture, dislocation, or joint effusion. Tricompartmental osteoarthritic spurring greatest at the lateral and patellofemoral compartments. IMPRESSION: 1. No acute finding. 2. Tricompartmental osteoarthritis. Electronically Signed   By: Jorje Guild M.D.   On: 09/29/2021 05:19    Procedures Procedures    Medications Ordered in ED Medications  oxyCODONE-acetaminophen (PERCOCET/ROXICET) 5-325 MG per tablet 1 tablet (1 tablet Oral Given 09/29/21 0053)  methocarbamol (ROBAXIN) tablet 1,000 mg (1,000 mg Oral Given 09/29/21 0053)  oxyCODONE-acetaminophen (PERCOCET/ROXICET) 5-325 MG per tablet 1 tablet (1 tablet Oral Given 09/29/21 0151)  ondansetron (ZOFRAN) injection 4 mg (4 mg Intravenous Given 09/29/21 0324)  HYDROmorphone (DILAUDID) injection 1 mg (1 mg Intravenous Given 09/29/21 0324)  methocarbamol (ROBAXIN) 1,000 mg in dextrose 5 % 100 mL IVPB (0 mg Intravenous Stopped 09/29/21 0405)    ED Course/ Medical Decision Making/ A&P                           Medical Decision Making Amount and/or Complexity of Data Reviewed Radiology: ordered.  Risk Prescription drug management.   Left leg pain and patient with known spinal  stenosis.  I suspect this is actually a manifestation of sciatica.  No obvious musculoskeletal issues regarding his leg.  Old records are reviewed, and he saw Dr. Renaldo Reel of [facility] on 08/16/2021 at which time he had made arrangements for lumbar epidural injection which is scheduled for today.  We will try symptomatic treatment with oxycodone-acetaminophen and oral methocarbamol.  NSAIDs cannot be given because of his history of having had a kidney removed.  He initially had no relief with oxycodone-acetaminophen.  Dose was repeated and still with no relief.  An IV was started and he was given methocarbamol and hydromorphone with excellent relief.  He was still complaining of some pain in his knee.  He was reexamined and there was good range of motion in the knee with only minimal pain.  He  continues to have straight leg raise positive, but now not positive until 45 degrees.  He now states that the bottom of his foot are numb, but less so the than it was.  On exam, no objective sensory loss.  I have ordered an x-ray of his left knee which shows some tricompartmental degenerative changes.  Have independently viewed the images, and agree with the radiologist's interpretation.  I do not believe his knee is the source of his pain.  He is discharged with a prescription for methocarbamol and oxycodone-acetaminophen, advised to keep his appointment later today for epidural injection.  Final Clinical Impression(s) / ED Diagnoses Final diagnoses:  Left sided sciatica  Acute pain of left knee    Rx / DC Orders ED Discharge Orders          Ordered    methocarbamol (ROBAXIN) 500 MG tablet  Every 6 hours PRN        09/29/21 0547    oxyCODONE-acetaminophen (PERCOCET/ROXICET) 5-325 MG tablet  Every 6 hours PRN        09/29/21 3007              Delora Fuel, MD 62/26/33 [phone]

## 2021-09-29 NOTE — ED Triage Notes (Signed)
Pt c/o left leg pain from buttocks to calf since driving this afternoon.

## 2021-09-29 NOTE — ED Notes (Signed)
Went over [employer] with pt. Verbalized understanding. All questions answered. Pt calling for ride.

## 2021-09-29 NOTE — ED Notes (Signed)
Assisted patient to restroom.

## 2021-10-07 DIAGNOSIS — E785 Hyperlipidemia, unspecified: Secondary | ICD-10-CM | POA: Diagnosis not present

## 2021-10-07 DIAGNOSIS — I10 Essential (primary) hypertension: Secondary | ICD-10-CM | POA: Diagnosis not present

## 2021-10-07 DIAGNOSIS — Z125 Encounter for screening for malignant neoplasm of prostate: Secondary | ICD-10-CM | POA: Diagnosis not present

## 2021-10-07 DIAGNOSIS — D3A8 Other benign neuroendocrine tumors: Secondary | ICD-10-CM | POA: Diagnosis not present

## 2021-10-07 DIAGNOSIS — N1832 Chronic kidney disease, stage 3b: Secondary | ICD-10-CM | POA: Diagnosis not present

## 2021-10-07 DIAGNOSIS — I25118 Atherosclerotic heart disease of native coronary artery with other forms of angina pectoris: Secondary | ICD-10-CM | POA: Diagnosis not present

## 2021-10-07 DIAGNOSIS — R911 Solitary pulmonary nodule: Secondary | ICD-10-CM | POA: Diagnosis not present

## 2021-11-10 DIAGNOSIS — I10 Essential (primary) hypertension: Secondary | ICD-10-CM | POA: Diagnosis not present

## 2021-11-10 DIAGNOSIS — M4726 Other spondylosis with radiculopathy, lumbar region: Secondary | ICD-10-CM | POA: Diagnosis not present

## 2021-11-10 DIAGNOSIS — M48061 Spinal stenosis, lumbar region without neurogenic claudication: Secondary | ICD-10-CM | POA: Diagnosis not present

## 2021-11-10 DIAGNOSIS — M5136 Other intervertebral disc degeneration, lumbar region: Secondary | ICD-10-CM | POA: Diagnosis not present

## 2021-11-16 DIAGNOSIS — D3A8 Other benign neuroendocrine tumors: Secondary | ICD-10-CM | POA: Diagnosis not present

## 2021-11-18 ENCOUNTER — Telehealth: Payer: Self-pay | Admitting: *Deleted

## 2021-11-18 NOTE — Telephone Encounter (Signed)
   Pre-operative Risk Assessment    Patient Name: Erik [name]  DOB: June 15, 1955 MRN: [id number]      Request for Surgical Clearance    Procedure:   EPIDURAL STEROID INJECTION  Date of Surgery:  Clearance TBD                                 Surgeon:  Dorene Ar, MD Surgeon's Group or Practice Name:  [facility] Phone number:  [phone] Fax number:  [phone]   Type of Clearance Requested:   - Pharmacy:  Hold Clopidogrel (Plavix) X'S 7 DAYS    Type of Anesthesia:  Not Indicated   Additional requests/questions:    Astrid Divine   11/18/2021, 2:33 PM

## 2021-11-19 ENCOUNTER — Telehealth: Payer: Self-pay | Admitting: *Deleted

## 2021-11-19 NOTE — Telephone Encounter (Signed)
Pt has been scheduled for tele visit 9/723 9:20.  Consent on file / medications reconciled.     Patient Consent for Virtual Visit        Erik [name] has provided verbal consent on 11/19/2021 for a virtual visit (video or telephone).   CONSENT FOR VIRTUAL VISIT FOR:  Erik [name]  By participating in this virtual visit I agree to the following:  I hereby voluntarily request, consent and authorize [facility] and its employed or contracted physicians, physician assistants, nurse practitioners or other licensed health care professionals (the Practitioner), to provide me with telemedicine health care services (the "Services") as deemed necessary by the treating Practitioner. I acknowledge and consent to receive the Services by the Practitioner via telemedicine. I understand that the telemedicine visit will involve communicating with the Practitioner through live audiovisual communication technology and the disclosure of certain medical information by electronic transmission. I acknowledge that I have been given the opportunity to request an in-person assessment or other available alternative prior to the telemedicine visit and am voluntarily participating in the telemedicine visit.  I understand that I have the right to withhold or withdraw my consent to the use of telemedicine in the course of my care at any time, without affecting my right to future care or treatment, and that the Practitioner or I may terminate the telemedicine visit at any time. I understand that I have the right to inspect all information obtained and/or recorded in the course of the telemedicine visit and may receive copies of available information for a reasonable fee.  I understand that some of the potential risks of receiving the Services via telemedicine include:  Delay or interruption in medical evaluation due to technological equipment failure or disruption; Information transmitted may not be sufficient (e.g. poor  resolution of images) to allow for appropriate medical decision making by the Practitioner; and/or  In rare instances, security protocols could fail, causing a breach of personal health information.  Furthermore, I acknowledge that it is my responsibility to provide information about my medical history, conditions and care that is complete and accurate to the best of my ability. I acknowledge that Practitioner's advice, recommendations, and/or decision may be based on factors not within their control, such as incomplete or inaccurate data provided by me or distortions of diagnostic images or specimens that may result from electronic transmissions. I understand that the practice of medicine is not an exact science and that Practitioner makes no warranties or guarantees regarding treatment outcomes. I acknowledge that a copy of this consent can be made available to me via my patient portal ([employer]), or I can request a printed copy by calling the office of [facility].    I understand that my insurance will be billed for this visit.   I have read or had this consent read to me. I understand the contents of this consent, which adequately explains the benefits and risks of the Services being provided via telemedicine.  I have been provided ample opportunity to ask questions regarding this consent and the Services and have had my questions answered to my satisfaction. I give my informed consent for the services to be provided through the use of telemedicine in my medical care

## 2021-11-19 NOTE — Telephone Encounter (Signed)
   Name: Erik [name]  DOB: [birth year]  MRN: [id number]  Primary Cardiologist: Jenkins Rouge, MD   Preoperative team, please contact this patient and set up a phone call appointment for further preoperative risk assessment. Please obtain consent and complete medication review. Thank you for your help.  I confirm that guidance regarding antiplatelet and oral anticoagulation therapy has been completed and, if necessary, noted below.  If patient remains asymptomatic, post protocol, she may hold Plavix for 5-7 days prior to procedure (she was previously cleared to hold Plavix for 7 days in April 2023 by Dr. [name]). Please resume Plavix as soon as possible postprocedure, at the discretion of the surgeon.  Lenna Sciara, NP 11/19/2021, 11:15 AM [facility] [address] [place], [place] [address]

## 2021-11-19 NOTE — Telephone Encounter (Signed)
Pt has been scheduled for a tele visit, 11/29/21.

## 2021-11-28 NOTE — Progress Notes (Signed)
Virtual Visit via Telephone Note   Because of Erik [name]'s co-morbid illnesses, he is at least at moderate risk for complications without adequate follow up.  This format is felt to be most appropriate for this patient at this time.  The patient did not have access to video technology/had technical difficulties with video requiring transitioning to audio format only (telephone).  All issues noted in this document were discussed and addressed.  No physical exam could be performed with this format.  Please refer to the patient's chart for his consent to telehealth for [facility]  Evaluation Performed:  Preoperative cardiovascular risk assessment _____________   Date:  11/28/2021   Patient ID:  Erik [name], DOB December 15, 1955, MRN [id number] Patient Location:  Home Provider location:   Office  Primary Care Provider:  Alfonso Ellis, NP Primary Cardiologist:  Jenkins Rouge, MD  Chief Complaint / Patient Profile   66 y.o. y/o male with a h/o CAD s/p CABG in 2009 with cath 03/2016 with 2/4 patent grafts, 90% ostial diagonal prior to LAD occlusion recommended for medical therapy, palpitations, HLD, ICM, CKD 3 post nephrectomy, and hypertension who is pending ESI and presents today for telephonic preoperative cardiovascular risk assessment.  Past Medical History    Past Medical History:  Diagnosis Date   Cancer [facility])    Renal cancer s/p Right nephrectomy   Chest pain 04/13/2016   CKD (chronic kidney disease) stage 3, GFR 30-59 ml/min (HCC) 09/01/2015   Coronary artery disease    Essential hypertension with goal blood pressure less than 130/85 09/01/2015   Hx of CABG    Hypertension    Renal disorder    Past Surgical History:  Procedure Laterality Date   CARDIAC CATHETERIZATION N/A 04/15/2016   Procedure: Left Heart Cath and Cors/Grafts Angiography;  Surgeon: Lorretta Harp, MD;  Location: [place] CV LAB;  Service: Cardiovascular;  Laterality: N/A;   CARDIAC SURGERY     LOOP  RECORDER INSERTION N/A 06/28/2016   Procedure: Loop Recorder Insertion;  Surgeon: [name], MD;  Location: [place] CV LAB;  Service: Cardiovascular;  Laterality: N/A;   NEPHRECTOMY Right     Allergies  Allergies  Allergen Reactions   Rosuvastatin Other (See Comments)    Generalized pain   Zetia [Ezetimibe]     Feels bad all over     History of Present Illness    Erik [name] is a 66 y.o. male who presents via audio/video conferencing for a telehealth visit today.  Pt was last seen in cardiology clinic on 06/24/21 by Dr. [name].  At that time Erik [name] was doing well.  The patient is now pending procedure as outlined above. Since his last visit, he denies chest pain, shortness of breath, lower extremity edema, fatigue, melena, hematuria, hemoptysis, diaphoresis, weakness, presyncope, syncope, orthopnea, and PND. Has frequent palpitations that he reports have not worsened recently.   Home Medications    Prior to Admission medications   Medication Sig Start Date End Date Taking? Authorizing Provider  acetaminophen (TYLENOL) 500 MG tablet Take 500-1,000 mg by mouth every 6 (six) hours as needed (for arthritic pain).    [provider]  amLODipine (NORVASC) 10 MG tablet Take 10 mg by mouth as directed.    [provider]  aspirin EC 81 MG tablet Take 1 tablet (81 mg total) by mouth daily. 10/30/17   Richardson Dopp T, PA-C  Cholecalciferol (VITAMIN D3) 2000 units capsule Take 2,000 Units by mouth daily.    [provider]  clopidogrel (PLAVIX) 75 MG tablet Take 75 mg by mouth daily. 07/31/15   [provider]  clotrimazole-betamethasone (LOTRISONE) cream Apply 1 application topically 2 (two) times daily. 04/13/21   [provider]  Evolocumab (REPATHA SURECLICK) 403 MG/ML SOAJ Inject 1 pen into the skin every 14 (fourteen) days. 07/07/20   [name], MD  famotidine (PEPCID) 40 MG tablet Take 1 tablet by mouth 2 (two) times daily. 04/27/21    [provider]  fenofibrate 54 MG tablet Take 54 mg by mouth daily. 04/29/21   [provider]  furosemide (LASIX) 20 MG tablet Take 1 tablet (20 mg total) by mouth as needed. 10/16/20   Imogene Burn, PA-C  icosapent Ethyl (VASCEPA) 1 g capsule Take 2 capsules (2 g total) by mouth 2 (two) times daily. 04/02/20   [name], MD  isosorbide mononitrate (IMDUR) 30 MG 24 hr tablet Take 1 tablet (30 mg total) by mouth daily. 10/10/18   [name], MD  lisinopril (PRINIVIL,ZESTRIL) 5 MG tablet Take 1 tablet (5 mg total) by mouth daily. 04/16/16   Rai, Vernelle Emerald, MD  methocarbamol (ROBAXIN) 500 MG tablet Take 1 tablet (500 mg total) by mouth every 6 (six) hours as needed for muscle spasms. 07/30/40   Delora Fuel, MD  metoprolol succinate (TOPROL-XL) 100 MG 24 hr tablet Take 100 mg by mouth daily. 06/02/16   [provider]  nitroGLYCERIN (NITROSTAT) 0.4 MG SL tablet Place 1 tablet (0.4 mg total) under the tongue every 5 (five) minutes as needed for chest pain. 01/10/19   [name], MD  oxyCODONE-acetaminophen (PERCOCET/ROXICET) 5-325 MG tablet Take 1-2 tablets by mouth every 6 (six) hours as needed for severe pain. 09[birth year]01[birth year]54   Delora Fuel, MD  vitamin B-12 (CYANOCOBALAMIN) 250 MCG tablet Take 250 mcg by mouth daily.     [provider]    Physical Exam    Vital Signs:  Erik [name] does have vital signs available for review today which are within normal range.  Given telephonic nature of communication, physical exam is limited. AAOx3. NAD. Normal affect.  Speech and respirations are unlabored.  Accessory Clinical Findings    None  Assessment & Plan    1.  Preoperative Cardiovascular Risk Assessment: The patient is doing well from a cardiac perspective. Therefore, based on ACC/AHA guidelines, the patient would be at acceptable risk for the planned procedure without further cardiovascular testing. The patient was advised that if he develops new  symptoms prior to surgery to contact our office to arrange for a follow-up visit, and he verbalized understanding. He may hold Plavix for 5-7 days prior to procedure. Please resume Plavix as soon as possible postprocedure, at the discretion of the surgeon. According to the Revised Cardiac Risk Index (RCRI), his Perioperative Risk of Major Cardiac Event is (%): 0.9. His Functional Capacity in METs is: 7.59 according to the Duke Activity Status Index (DASI).  A copy of this note will be routed to requesting surgeon.  Time:   Today, I have spent 10 minutes with the patient with telehealth technology discussing medical history, symptoms, and management plan.    Emmaline Life, NP-C    11/29/2021, 9:24 AM [place] [address] Office [phone] Fax [phone]

## 2021-11-29 ENCOUNTER — Encounter: Payer: Self-pay | Admitting: Nurse Practitioner

## 2021-11-29 ENCOUNTER — Ambulatory Visit (INDEPENDENT_AMBULATORY_CARE_PROVIDER_SITE_OTHER): Payer: Medicare Other | Admitting: Nurse Practitioner

## 2021-11-29 VITALS — BP 123/68 | HR 56

## 2021-11-29 DIAGNOSIS — Z01810 Encounter for preprocedural cardiovascular examination: Secondary | ICD-10-CM

## 2021-11-29 NOTE — Telephone Encounter (Signed)
Jefferson called on an update on clearance form being faxed over. Informed her that pt just had preop visit this morning and clearance form will be sent.

## 2021-12-13 DIAGNOSIS — M4727 Other spondylosis with radiculopathy, lumbosacral region: Secondary | ICD-10-CM | POA: Diagnosis not present

## 2021-12-13 DIAGNOSIS — M5117 Intervertebral disc disorders with radiculopathy, lumbosacral region: Secondary | ICD-10-CM | POA: Diagnosis not present

## 2021-12-13 DIAGNOSIS — M4726 Other spondylosis with radiculopathy, lumbar region: Secondary | ICD-10-CM | POA: Diagnosis not present

## 2021-12-13 DIAGNOSIS — M5116 Intervertebral disc disorders with radiculopathy, lumbar region: Secondary | ICD-10-CM | POA: Diagnosis not present

## 2021-12-15 DIAGNOSIS — M4726 Other spondylosis with radiculopathy, lumbar region: Secondary | ICD-10-CM | POA: Diagnosis not present

## 2021-12-15 DIAGNOSIS — M48061 Spinal stenosis, lumbar region without neurogenic claudication: Secondary | ICD-10-CM | POA: Diagnosis not present

## 2021-12-15 DIAGNOSIS — M5136 Other intervertebral disc degeneration, lumbar region: Secondary | ICD-10-CM | POA: Diagnosis not present

## 2021-12-20 DIAGNOSIS — I10 Essential (primary) hypertension: Secondary | ICD-10-CM | POA: Diagnosis not present

## 2021-12-20 DIAGNOSIS — N1832 Chronic kidney disease, stage 3b: Secondary | ICD-10-CM | POA: Diagnosis not present

## 2021-12-30 NOTE — Progress Notes (Signed)
Office Visit    Patient Name: Erik [name] Date of Encounter: 01/12/2022  PCP:  Alfonso Ellis, [facility]  Cardiologist:  Jenkins Rouge, MD      Chief Complaint    Erik [name] is a 66 y.o. male with a hx of CAD s/p CABG in 2009 with cath 03/2016 with 2/4 patent grafts, 90% ostial diagonal prior to LAD occlusion recommended for medical therapy.  SVG to RCA/OM occluded  HLD, ICM, hiatal hernia, CKD3 post nephrectomy Cr 2.4 , and HTN   Past Medical History    Past Medical History:  Diagnosis Date   Cancer ([facility])    Renal cancer s/p Right nephrectomy   Chest pain 04/13/2016   CKD (chronic kidney disease) stage 3, GFR 30-59 ml/min (HCC) 09/01/2015   Coronary artery disease    Essential hypertension with goal blood pressure less than 130/85 09/01/2015   Hx of CABG    Hypertension    Renal disorder    Past Surgical History:  Procedure Laterality Date   CARDIAC CATHETERIZATION N/A 04/15/2016   Procedure: Left Heart Cath and Cors/Grafts Angiography;  Surgeon: Lorretta Harp, MD;  Location: [place] CV LAB;  Service: Cardiovascular;  Laterality: N/A;   CARDIAC SURGERY     LOOP RECORDER INSERTION N/A 06/28/2016   Procedure: Loop Recorder Insertion;  Surgeon: [name], MD;  Location: [place] [facility];  Service: Cardiovascular;  Laterality: N/A;   NEPHRECTOMY Right     Allergies  Allergies  Allergen Reactions   Rosuvastatin Other (See Comments)    Generalized pain   Zetia [Ezetimibe]     Feels bad all over     History of Present Illness    66 y.o. with above history Also cryptogenic stroke with ILR showing PVCs. He was evaluated by Dr. Rayann Heman and PVC were too infrequent for ablation and patient preferred to avoid AAD. Recommendation was to continue Toprol and consider Mexiletine if it persisted.  ILR EOL  Monitor 09/09/20 showed predominantly NSR, 8 runs of VT longest 4 beats, and PVC burden 5.6%.   Seen in [place] ED 02/04/21.Had PUD  with EGD 2 weeks before this and chest pain/symptoms thought due to this HS troponin negative x2. EKG no acute changes.  He has had multiple EGDls with [facility]. 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 Ok to hold plavix 5 days before and 5 days after No angina Chronic palpitations from [facility]  Monitor 09/09/20 with PVC;s no NSVT <1% total beats so Rx beta blocker not frequent enough to consider AAT/Ablation   Had ESI last month at [facility]  Dr Francesco Runner for lumbar radiculopathy Held Plavix for 5 days with no issues   Has a nice anniversary edition Markus Daft that he customized.   EKGs/Labs/Other Studies Reviewed:   The following studies were reviewed today:  ZIO 09/09/20  Summary:  NSR average HR 74 bpm No significant sustained arrhythmias. NSVT longest 4 beats Couplets, Bigeminy rare  ZIO 07/2020  Summary:  NSR average HR 74 bpm No significant sustained arrhythmias. NSVT longest 4 beats Couplets, Bigeminy rare   Echo 01/22/19 IMPRESSIONS     1. Left ventricular ejection fraction, by visual estimation, is 50 to  55%. The left ventricle has normal function. Normal left ventricular size.  There is no left ventricular hypertrophy.   2. Left ventricular diastolic Doppler parameters are consistent with  impaired relaxation pattern of LV diastolic filling.   3. Global right ventricle  has normal systolic function.The right  ventricular size is mildly enlarged. No increase in right ventricular wall  thickness.   4. Left atrial size was mildly dilated.   5. Right atrial size was normal.   6. Mild mitral annular calcification.   7. The mitral valve is normal in structure. Trace mitral valve  regurgitation. No evidence of mitral stenosis.   8. The tricuspid valve is normal in structure. Tricuspid valve  regurgitation was not visualized by color flow Doppler.   9. The aortic valve is normal in structure. Aortic valve regurgitation  was not visualized by color flow Doppler.  Structurally normal aortic  valve, with no evidence of sclerosis or stenosis.  10. The pulmonic valve was normal in structure. Pulmonic valve  regurgitation is mild by color flow Doppler.  11. Normal pulmonary artery systolic pressure.  12. The inferior vena cava is normal in size with greater than 50%  respiratory variability, suggesting right atrial pressure of 3 mmHg.   EKG:   SR rate 67 PR 215 msec 02/05/21 01/12/2022 SR rate 69 normal   Recent Labs: No results found for requested labs within last 365 days.  Recent Lipid Panel    Component Value Date/Time   CHOL 125 01/10/2019 1015   TRIG 144 01/10/2019 1015   HDL 48 01/10/2019 1015   CHOLHDL 2.6 01/10/2019 1015   CHOLHDL 5.4 04/15/2016 0339   VLDL 57 (H) 04/15/2016 0339   LDLCALC 52 01/10/2019 1015   LDLDIRECT 124 (H) 12/04/2017 1001    Home Medications   Current Meds  Medication Sig   acetaminophen (TYLENOL) 500 MG tablet Take 500-1,000 mg by mouth every 6 (six) hours as needed (for arthritic pain).   amLODipine (NORVASC) 10 MG tablet Take 10 mg by mouth as directed.   aspirin EC 81 MG tablet Take 1 tablet (81 mg total) by mouth daily.   Cholecalciferol (VITAMIN D3) 2000 units capsule Take 2,000 Units by mouth daily.   clopidogrel (PLAVIX) 75 MG tablet Take 75 mg by mouth daily.   Evolocumab (REPATHA SURECLICK) 086 MG/ML SOAJ Inject 1 pen into the skin every 14 (fourteen) days.   famotidine (PEPCID) 40 MG tablet Take 1 tablet by mouth 2 (two) times daily.   fenofibrate 54 MG tablet Take 54 mg by mouth daily.   furosemide (LASIX) 20 MG tablet Take 1 tablet (20 mg total) by mouth as needed.   icosapent Ethyl (VASCEPA) 1 g capsule Take 2 capsules (2 g total) by mouth 2 (two) times daily.   isosorbide mononitrate (IMDUR) 30 MG 24 hr tablet Take 1 tablet (30 mg total) by mouth daily.   lisinopril (PRINIVIL,ZESTRIL) 5 MG tablet Take 1 tablet (5 mg total) by mouth daily.   metoprolol succinate (TOPROL-XL) 100 MG 24 hr tablet  Take 100 mg by mouth daily.   nitroGLYCERIN (NITROSTAT) 0.4 MG SL tablet Place 1 tablet (0.4 mg total) under the tongue every 5 (five) minutes as needed for chest pain.   vitamin B-12 (CYANOCOBALAMIN) 250 MCG tablet Take 250 mcg by mouth daily.      Review of Systems      All other systems reviewed and are otherwise negative except as noted above.  Physical Exam    VS:  BP 112/76   Pulse 66   Ht '6\' 1"'$  (1.854 m)   Wt 254 lb (115.2 kg)   SpO2 96%   BMI 33.51 kg/m  , BMI Body mass index is 33.51 kg/m.  Wt Readings from Last  3 Encounters:  01/12/22 254 lb (115.2 kg)  09/29/21 263 lb (119.3 kg)  06/24/21 266 lb (120.7 kg)    Affect appropriate Healthy:  appears stated age 61: normal Neck supple with no adenopathy JVP normal no bruits no thyromegaly Lungs clear with no wheezing and good diaphragmatic motion Heart:  S1/S2 no murmur, no rub, gallop or click PMI normal Post sternotomy  Abdomen: benighn, post nephrectomy right Distal pulses intact with no bruits No edema Neuro non-focal Skin warm and dry No muscular weakness   Assessment & Plan    CAD - CABG 2009 Cath 2017 medical Rx GDMT includes aspirin, plavix, metoprolol, repatha, imdur.   HTN - BP well controlled. Continue current antihypertensive regimen.    Hiatal hernia - With PUD Did not tolerate Protonix due to palpitation but has been prescribed Prilosec by GI to try. Encouraged to start. Educational handout on hiatal hernia provided.   CKD3 - s/p nephrectomy Cr 2.36 f/u nephrology  5. GI:  history of ulcers healed with H2 blocker. Sub mucosal carcinoid removed   6. PVC;s;  continue beta blocker may need to repeat monitor in future If he becomes more symptomatic    Jenkins Rouge MD [facility]

## 2022-01-12 ENCOUNTER — Ambulatory Visit: Payer: Medicare Other | Attending: Cardiovascular Disease | Admitting: Cardiovascular Disease

## 2022-01-12 ENCOUNTER — Encounter: Payer: Self-pay | Admitting: Cardiovascular Disease

## 2022-01-12 VITALS — BP 112/76 | HR 66 | Ht 73.0 in | Wt 254.0 lb

## 2022-01-12 DIAGNOSIS — E785 Hyperlipidemia, unspecified: Secondary | ICD-10-CM

## 2022-01-12 DIAGNOSIS — R002 Palpitations: Secondary | ICD-10-CM | POA: Diagnosis not present

## 2022-01-12 DIAGNOSIS — I10 Essential (primary) hypertension: Secondary | ICD-10-CM | POA: Diagnosis not present

## 2022-01-12 DIAGNOSIS — N183 Chronic kidney disease, stage 3 unspecified: Secondary | ICD-10-CM

## 2022-01-12 DIAGNOSIS — I25118 Atherosclerotic heart disease of native coronary artery with other forms of angina pectoris: Secondary | ICD-10-CM | POA: Diagnosis not present

## 2022-01-12 NOTE — Patient Instructions (Addendum)
Medication Instructions:  Your physician recommends that you continue on your current medications as directed. Please refer to the Current Medication list given to you today.  *If you need a refill on your cardiac medications before your next appointment, please call your pharmacy*  Lab Work: If you have labs (blood work) drawn today and your tests are completely normal, you will receive your results only by: MyChart Message (if you have MyChart) OR A paper copy in the mail If you have any lab test that is abnormal or we need to change your treatment, we will call you to review the results.  Testing/Procedures: None ordered today.  Follow-Up: At [facility], you and your health needs are our priority.  As part of our continuing mission to provide you with exceptional heart care, we have created designated Provider Care Teams.  These Care Teams include your primary Cardiologist (physician) and Advanced Practice Providers (APPs -  Physician Assistants and Nurse Practitioners) who all work together to provide you with the care you need, when you need it.  We recommend signing up for the patient portal called "MyChart".  Sign up information is provided on this After Visit Summary.  MyChart is used to connect with patients for Virtual Visits (Telemedicine).  Patients are able to view lab/test results, encounter notes, upcoming appointments, etc.  Non-urgent messages can be sent to your provider as well.   To learn more about what you can do with MyChart, go to [email or web address].    Your next appointment:   12 month(s)  The format for your next appointment:   In Person  Provider:   Peter Nishan, MD      Important Information About Sugar       

## 2022-01-18 DIAGNOSIS — K2970 Gastritis, unspecified, without bleeding: Secondary | ICD-10-CM | POA: Diagnosis not present

## 2022-01-18 DIAGNOSIS — L905 Scar conditions and fibrosis of skin: Secondary | ICD-10-CM | POA: Diagnosis not present

## 2022-01-18 DIAGNOSIS — Z85028 Personal history of other malignant neoplasm of stomach: Secondary | ICD-10-CM | POA: Diagnosis not present

## 2022-01-18 DIAGNOSIS — C7A092 Malignant carcinoid tumor of the stomach: Secondary | ICD-10-CM | POA: Diagnosis not present

## 2022-01-18 DIAGNOSIS — K2289 Other specified disease of esophagus: Secondary | ICD-10-CM | POA: Diagnosis not present

## 2022-01-18 DIAGNOSIS — K3189 Other diseases of stomach and duodenum: Secondary | ICD-10-CM | POA: Diagnosis not present

## 2022-01-31 DIAGNOSIS — D3A8 Other benign neuroendocrine tumors: Secondary | ICD-10-CM | POA: Diagnosis not present

## 2022-01-31 DIAGNOSIS — K4020 Bilateral inguinal hernia, without obstruction or gangrene, not specified as recurrent: Secondary | ICD-10-CM | POA: Diagnosis not present

## 2022-01-31 DIAGNOSIS — I700 Atherosclerosis of aorta: Secondary | ICD-10-CM | POA: Diagnosis not present

## 2022-01-31 DIAGNOSIS — Z85528 Personal history of other malignant neoplasm of kidney: Secondary | ICD-10-CM | POA: Diagnosis not present

## 2022-02-16 DIAGNOSIS — D3A8 Other benign neuroendocrine tumors: Secondary | ICD-10-CM | POA: Diagnosis not present

## 2022-02-24 DIAGNOSIS — I10 Essential (primary) hypertension: Secondary | ICD-10-CM | POA: Diagnosis not present

## 2022-02-24 DIAGNOSIS — M7918 Myalgia, other site: Secondary | ICD-10-CM | POA: Diagnosis not present

## 2022-02-24 DIAGNOSIS — M4726 Other spondylosis with radiculopathy, lumbar region: Secondary | ICD-10-CM | POA: Diagnosis not present

## 2022-02-24 DIAGNOSIS — M5136 Other intervertebral disc degeneration, lumbar region: Secondary | ICD-10-CM | POA: Diagnosis not present

## 2022-02-25 ENCOUNTER — Telehealth: Payer: Self-pay

## 2022-02-25 NOTE — Telephone Encounter (Signed)
   Pre-operative Risk Assessment    Patient Name: Erik [name]  DOB: [birth year] MRN: [id number]     Request for Surgical Clearance    Procedure:  L5-S1 ESI  Date of Surgery:  Clearance TBD                                 Surgeon:  Dr. Dorene Ar  Surgeon's Group or Practice Name:  [facility]  Phone number:  [phone] Fax number:     Type of Clearance Requested:   - Pharmacy:  Hold Clopidogrel (Plavix)     Type of Anesthesia:  Not Indicated   Additional requests/questions:    [name]   02/25/2022, 11:20 AM

## 2022-02-25 NOTE — Telephone Encounter (Signed)
   Patient Name: Erik [name]  DOB: 11/08/1955 MRN: [id number]  Primary Cardiologist: Jenkins Rouge, MD  Chart reviewed as part of pre-operative protocol coverage. Given past medical history and time since last visit, based on ACC/AHA guidelines, Erik [name] is at acceptable risk for the planned procedure without further cardiovascular testing.   Per office protocol, he may hold plavix for 5 days prior to procedure. Please resume Plavix as soon as possible postprocedure, at the discretion of the surgeon.   I will route this recommendation to the requesting party via Epic fax function and remove from pre-op pool.  Please call with questions.  Lenna Sciara, NP 02/25/2022, 12:23 PM

## 2022-04-06 NOTE — Telephone Encounter (Signed)
I will fax clearance notes update ok to hold Plavix x 5-7 days and resume once surgeon feels it safe post procedure.   Fax# [phone] attn: Hannah Beat. CMA

## 2022-04-06 NOTE — Telephone Encounter (Signed)
No fax number available. Please call to get fax number or simply call them with updated recommendations. TY!

## 2022-04-06 NOTE — Telephone Encounter (Signed)
   Patient Name: Erik [name]  DOB: Oct 31, 1955 MRN: [id number]  Primary Cardiologist: Jenkins Rouge, MD  Chart reviewed as part of pre-operative protocol coverage. Given past medical history and time since last visit, based on ACC/AHA guidelines, Erik [name] is at acceptable risk for the planned procedure without further cardiovascular testing.   Permissible to hold Plavix 5-7 days prior to procedure.   I will route this recommendation to the requesting party via Epic fax function and remove from pre-op pool.  Please call with questions.  Loel Dubonnet, NP 04/06/2022, 10:18 AM

## 2022-04-06 NOTE — Telephone Encounter (Signed)
DUPLICATE CLEARANCE REQUEST CAME IN STATING URGENT 2ND REQUEST; OUR OFFICE FAXED CLEARANCE ON  03/07/22 @ 12:23 EMILY MONGE, NP; THIS CLEARANCE ALSO HAS THE RECOMMENDATIONS OK TO HOLD PLAVIX x 5 DAYS AND RESUME ONCE FELT SAFE POST PROCEDURE.   REQUEST TODAY STATES HOLD PLAVIX x 7 DAYS. I WILL PRESENT THIS BACK TO THE PRE OP TEAM FOR REVIEW.

## 2022-04-21 DIAGNOSIS — M791 Myalgia, unspecified site: Secondary | ICD-10-CM | POA: Diagnosis not present

## 2022-04-21 DIAGNOSIS — E785 Hyperlipidemia, unspecified: Secondary | ICD-10-CM | POA: Diagnosis not present

## 2022-04-21 DIAGNOSIS — R748 Abnormal levels of other serum enzymes: Secondary | ICD-10-CM | POA: Diagnosis not present

## 2022-04-21 DIAGNOSIS — E559 Vitamin D deficiency, unspecified: Secondary | ICD-10-CM | POA: Diagnosis not present

## 2022-04-21 DIAGNOSIS — I10 Essential (primary) hypertension: Secondary | ICD-10-CM | POA: Diagnosis not present

## 2022-04-21 DIAGNOSIS — Z8673 Personal history of transient ischemic attack (TIA), and cerebral infarction without residual deficits: Secondary | ICD-10-CM | POA: Diagnosis not present

## 2022-04-21 DIAGNOSIS — F411 Generalized anxiety disorder: Secondary | ICD-10-CM | POA: Diagnosis not present

## 2022-04-21 DIAGNOSIS — Z23 Encounter for immunization: Secondary | ICD-10-CM | POA: Diagnosis not present

## 2022-04-21 DIAGNOSIS — R7301 Impaired fasting glucose: Secondary | ICD-10-CM | POA: Diagnosis not present

## 2022-04-21 DIAGNOSIS — I25810 Atherosclerosis of coronary artery bypass graft(s) without angina pectoris: Secondary | ICD-10-CM | POA: Diagnosis not present

## 2022-04-21 DIAGNOSIS — N1832 Chronic kidney disease, stage 3b: Secondary | ICD-10-CM | POA: Diagnosis not present

## 2022-05-01 DIAGNOSIS — L0231 Cutaneous abscess of buttock: Secondary | ICD-10-CM | POA: Diagnosis not present

## 2022-05-05 DIAGNOSIS — K3182 Dieulafoy lesion (hemorrhagic) of stomach and duodenum: Secondary | ICD-10-CM | POA: Diagnosis not present

## 2022-05-05 DIAGNOSIS — K2210 Ulcer of esophagus without bleeding: Secondary | ICD-10-CM | POA: Diagnosis not present

## 2022-05-05 DIAGNOSIS — R109 Unspecified abdominal pain: Secondary | ICD-10-CM | POA: Diagnosis not present

## 2022-05-18 DIAGNOSIS — D3A8 Other benign neuroendocrine tumors: Secondary | ICD-10-CM | POA: Diagnosis not present

## 2022-05-19 DIAGNOSIS — M5136 Other intervertebral disc degeneration, lumbar region: Secondary | ICD-10-CM | POA: Diagnosis not present

## 2022-05-19 DIAGNOSIS — M4726 Other spondylosis with radiculopathy, lumbar region: Secondary | ICD-10-CM | POA: Diagnosis not present

## 2022-05-25 ENCOUNTER — Other Ambulatory Visit (HOSPITAL_COMMUNITY): Payer: Self-pay

## 2022-05-25 DIAGNOSIS — C7A8 Other malignant neuroendocrine tumors: Secondary | ICD-10-CM | POA: Diagnosis not present

## 2022-05-25 DIAGNOSIS — Z1330 Encounter for screening examination for mental health and behavioral disorders, unspecified: Secondary | ICD-10-CM | POA: Diagnosis not present

## 2022-06-02 DIAGNOSIS — N1832 Chronic kidney disease, stage 3b: Secondary | ICD-10-CM | POA: Diagnosis not present

## 2022-06-02 DIAGNOSIS — R630 Anorexia: Secondary | ICD-10-CM | POA: Diagnosis not present

## 2022-06-02 DIAGNOSIS — K5900 Constipation, unspecified: Secondary | ICD-10-CM | POA: Diagnosis not present

## 2022-06-02 DIAGNOSIS — R1010 Upper abdominal pain, unspecified: Secondary | ICD-10-CM | POA: Diagnosis not present

## 2022-06-02 DIAGNOSIS — R55 Syncope and collapse: Secondary | ICD-10-CM | POA: Diagnosis not present

## 2022-06-08 DIAGNOSIS — N1832 Chronic kidney disease, stage 3b: Secondary | ICD-10-CM | POA: Diagnosis not present

## 2022-06-13 DIAGNOSIS — Z6834 Body mass index (BMI) 34.0-34.9, adult: Secondary | ICD-10-CM | POA: Diagnosis not present

## 2022-06-13 DIAGNOSIS — R0989 Other specified symptoms and signs involving the circulatory and respiratory systems: Secondary | ICD-10-CM | POA: Diagnosis not present

## 2022-06-13 DIAGNOSIS — I517 Cardiomegaly: Secondary | ICD-10-CM | POA: Diagnosis not present

## 2022-06-13 DIAGNOSIS — N179 Acute kidney failure, unspecified: Secondary | ICD-10-CM | POA: Diagnosis not present

## 2022-06-13 DIAGNOSIS — I2510 Atherosclerotic heart disease of native coronary artery without angina pectoris: Secondary | ICD-10-CM | POA: Diagnosis not present

## 2022-06-13 DIAGNOSIS — J986 Disorders of diaphragm: Secondary | ICD-10-CM | POA: Diagnosis not present

## 2022-06-13 DIAGNOSIS — K2970 Gastritis, unspecified, without bleeding: Secondary | ICD-10-CM | POA: Diagnosis not present

## 2022-06-13 DIAGNOSIS — R079 Chest pain, unspecified: Secondary | ICD-10-CM | POA: Diagnosis not present

## 2022-06-13 DIAGNOSIS — I249 Acute ischemic heart disease, unspecified: Secondary | ICD-10-CM | POA: Diagnosis not present

## 2022-06-13 DIAGNOSIS — N1832 Chronic kidney disease, stage 3b: Secondary | ICD-10-CM | POA: Diagnosis not present

## 2022-06-13 DIAGNOSIS — T82898A Other specified complication of vascular prosthetic devices, implants and grafts, initial encounter: Secondary | ICD-10-CM | POA: Diagnosis not present

## 2022-06-13 DIAGNOSIS — I088 Other rheumatic multiple valve diseases: Secondary | ICD-10-CM | POA: Diagnosis not present

## 2022-06-13 DIAGNOSIS — I25810 Atherosclerosis of coronary artery bypass graft(s) without angina pectoris: Secondary | ICD-10-CM | POA: Diagnosis not present

## 2022-06-13 DIAGNOSIS — K219 Gastro-esophageal reflux disease without esophagitis: Secondary | ICD-10-CM | POA: Diagnosis not present

## 2022-06-13 DIAGNOSIS — T508X5A Adverse effect of diagnostic agents, initial encounter: Secondary | ICD-10-CM | POA: Diagnosis not present

## 2022-06-13 DIAGNOSIS — I255 Ischemic cardiomyopathy: Secondary | ICD-10-CM | POA: Diagnosis not present

## 2022-06-13 DIAGNOSIS — E669 Obesity, unspecified: Secondary | ICD-10-CM | POA: Diagnosis not present

## 2022-06-13 DIAGNOSIS — N1411 Contrast-induced nephropathy: Secondary | ICD-10-CM | POA: Diagnosis not present

## 2022-06-13 DIAGNOSIS — R0789 Other chest pain: Secondary | ICD-10-CM | POA: Diagnosis not present

## 2022-06-13 DIAGNOSIS — I129 Hypertensive chronic kidney disease with stage 1 through stage 4 chronic kidney disease, or unspecified chronic kidney disease: Secondary | ICD-10-CM | POA: Diagnosis not present

## 2022-06-13 DIAGNOSIS — I10 Essential (primary) hypertension: Secondary | ICD-10-CM | POA: Diagnosis not present

## 2022-06-13 DIAGNOSIS — R9430 Abnormal result of cardiovascular function study, unspecified: Secondary | ICD-10-CM | POA: Diagnosis not present

## 2022-06-13 DIAGNOSIS — J984 Other disorders of lung: Secondary | ICD-10-CM | POA: Diagnosis not present

## 2022-06-13 DIAGNOSIS — K449 Diaphragmatic hernia without obstruction or gangrene: Secondary | ICD-10-CM | POA: Diagnosis not present

## 2022-06-13 DIAGNOSIS — E785 Hyperlipidemia, unspecified: Secondary | ICD-10-CM | POA: Diagnosis not present

## 2022-06-13 DIAGNOSIS — I214 Non-ST elevation (NSTEMI) myocardial infarction: Secondary | ICD-10-CM | POA: Diagnosis not present

## 2022-06-13 DIAGNOSIS — F411 Generalized anxiety disorder: Secondary | ICD-10-CM | POA: Diagnosis not present

## 2022-06-17 DIAGNOSIS — I6523 Occlusion and stenosis of bilateral carotid arteries: Secondary | ICD-10-CM | POA: Diagnosis not present

## 2022-06-17 DIAGNOSIS — I2510 Atherosclerotic heart disease of native coronary artery without angina pectoris: Secondary | ICD-10-CM | POA: Diagnosis not present

## 2022-06-17 DIAGNOSIS — Z955 Presence of coronary angioplasty implant and graft: Secondary | ICD-10-CM | POA: Diagnosis not present

## 2022-06-17 DIAGNOSIS — I6782 Cerebral ischemia: Secondary | ICD-10-CM | POA: Diagnosis not present

## 2022-06-17 DIAGNOSIS — Z7901 Long term (current) use of anticoagulants: Secondary | ICD-10-CM | POA: Diagnosis not present

## 2022-06-17 DIAGNOSIS — I130 Hypertensive heart and chronic kidney disease with heart failure and stage 1 through stage 4 chronic kidney disease, or unspecified chronic kidney disease: Secondary | ICD-10-CM | POA: Diagnosis not present

## 2022-06-17 DIAGNOSIS — N1832 Chronic kidney disease, stage 3b: Secondary | ICD-10-CM | POA: Diagnosis not present

## 2022-06-17 DIAGNOSIS — I509 Heart failure, unspecified: Secondary | ICD-10-CM | POA: Diagnosis not present

## 2022-06-17 DIAGNOSIS — D1802 Hemangioma of intracranial structures: Secondary | ICD-10-CM | POA: Diagnosis not present

## 2022-06-17 DIAGNOSIS — I083 Combined rheumatic disorders of mitral, aortic and tricuspid valves: Secondary | ICD-10-CM | POA: Diagnosis not present

## 2022-06-17 DIAGNOSIS — I5022 Chronic systolic (congestive) heart failure: Secondary | ICD-10-CM | POA: Diagnosis not present

## 2022-06-17 DIAGNOSIS — G8194 Hemiplegia, unspecified affecting left nondominant side: Secondary | ICD-10-CM | POA: Diagnosis not present

## 2022-06-17 DIAGNOSIS — I618 Other nontraumatic intracerebral hemorrhage: Secondary | ICD-10-CM | POA: Diagnosis not present

## 2022-06-17 DIAGNOSIS — G459 Transient cerebral ischemic attack, unspecified: Secondary | ICD-10-CM | POA: Diagnosis not present

## 2022-06-17 DIAGNOSIS — R29703 NIHSS score 3: Secondary | ICD-10-CM | POA: Diagnosis not present

## 2022-06-17 DIAGNOSIS — E669 Obesity, unspecified: Secondary | ICD-10-CM | POA: Diagnosis not present

## 2022-06-17 DIAGNOSIS — I10 Essential (primary) hypertension: Secondary | ICD-10-CM | POA: Diagnosis not present

## 2022-06-17 DIAGNOSIS — R29818 Other symptoms and signs involving the nervous system: Secondary | ICD-10-CM | POA: Diagnosis not present

## 2022-06-17 DIAGNOSIS — H538 Other visual disturbances: Secondary | ICD-10-CM | POA: Diagnosis not present

## 2022-06-17 DIAGNOSIS — I255 Ischemic cardiomyopathy: Secondary | ICD-10-CM | POA: Diagnosis not present

## 2022-06-17 DIAGNOSIS — H5347 Heteronymous bilateral field defects: Secondary | ICD-10-CM | POA: Diagnosis not present

## 2022-06-17 DIAGNOSIS — R202 Paresthesia of skin: Secondary | ICD-10-CM | POA: Diagnosis not present

## 2022-06-17 DIAGNOSIS — I639 Cerebral infarction, unspecified: Secondary | ICD-10-CM | POA: Diagnosis not present

## 2022-06-17 DIAGNOSIS — K259 Gastric ulcer, unspecified as acute or chronic, without hemorrhage or perforation: Secondary | ICD-10-CM | POA: Diagnosis not present

## 2022-06-17 DIAGNOSIS — I25810 Atherosclerosis of coronary artery bypass graft(s) without angina pectoris: Secondary | ICD-10-CM | POA: Diagnosis not present

## 2022-06-17 DIAGNOSIS — K219 Gastro-esophageal reflux disease without esophagitis: Secondary | ICD-10-CM | POA: Diagnosis not present

## 2022-06-17 DIAGNOSIS — E785 Hyperlipidemia, unspecified: Secondary | ICD-10-CM | POA: Diagnosis not present

## 2022-06-17 DIAGNOSIS — I6389 Other cerebral infarction: Secondary | ICD-10-CM | POA: Diagnosis not present

## 2022-06-17 DIAGNOSIS — I619 Nontraumatic intracerebral hemorrhage, unspecified: Secondary | ICD-10-CM | POA: Diagnosis not present

## 2022-06-17 DIAGNOSIS — I6381 Other cerebral infarction due to occlusion or stenosis of small artery: Secondary | ICD-10-CM | POA: Diagnosis not present

## 2022-06-17 DIAGNOSIS — E7849 Other hyperlipidemia: Secondary | ICD-10-CM | POA: Diagnosis not present

## 2022-06-17 DIAGNOSIS — R9082 White matter disease, unspecified: Secondary | ICD-10-CM | POA: Diagnosis not present

## 2022-06-21 DIAGNOSIS — I10 Essential (primary) hypertension: Secondary | ICD-10-CM | POA: Diagnosis not present

## 2022-06-21 DIAGNOSIS — Z8673 Personal history of transient ischemic attack (TIA), and cerebral infarction without residual deficits: Secondary | ICD-10-CM | POA: Diagnosis not present

## 2022-06-21 DIAGNOSIS — I25810 Atherosclerosis of coronary artery bypass graft(s) without angina pectoris: Secondary | ICD-10-CM | POA: Diagnosis not present

## 2022-06-21 DIAGNOSIS — E785 Hyperlipidemia, unspecified: Secondary | ICD-10-CM | POA: Diagnosis not present

## 2022-06-21 DIAGNOSIS — N184 Chronic kidney disease, stage 4 (severe): Secondary | ICD-10-CM | POA: Diagnosis not present

## 2022-07-06 DIAGNOSIS — Z8673 Personal history of transient ischemic attack (TIA), and cerebral infarction without residual deficits: Secondary | ICD-10-CM | POA: Diagnosis not present

## 2022-07-06 DIAGNOSIS — I25810 Atherosclerosis of coronary artery bypass graft(s) without angina pectoris: Secondary | ICD-10-CM | POA: Diagnosis not present

## 2022-07-06 DIAGNOSIS — I10 Essential (primary) hypertension: Secondary | ICD-10-CM | POA: Diagnosis not present

## 2022-07-06 DIAGNOSIS — E785 Hyperlipidemia, unspecified: Secondary | ICD-10-CM | POA: Diagnosis not present

## 2022-07-06 DIAGNOSIS — Z1330 Encounter for screening examination for mental health and behavioral disorders, unspecified: Secondary | ICD-10-CM | POA: Diagnosis not present

## 2022-07-08 DIAGNOSIS — I10 Essential (primary) hypertension: Secondary | ICD-10-CM | POA: Diagnosis not present

## 2022-07-08 DIAGNOSIS — Z951 Presence of aortocoronary bypass graft: Secondary | ICD-10-CM | POA: Diagnosis not present

## 2022-07-08 DIAGNOSIS — I214 Non-ST elevation (NSTEMI) myocardial infarction: Secondary | ICD-10-CM | POA: Diagnosis not present

## 2022-07-08 DIAGNOSIS — E785 Hyperlipidemia, unspecified: Secondary | ICD-10-CM | POA: Diagnosis not present

## 2022-07-12 DIAGNOSIS — Q612 Polycystic kidney, adult type: Secondary | ICD-10-CM | POA: Diagnosis not present

## 2022-07-12 DIAGNOSIS — C7A8 Other malignant neuroendocrine tumors: Secondary | ICD-10-CM | POA: Diagnosis not present

## 2022-07-15 DIAGNOSIS — I10 Essential (primary) hypertension: Secondary | ICD-10-CM | POA: Diagnosis not present

## 2022-07-15 DIAGNOSIS — E785 Hyperlipidemia, unspecified: Secondary | ICD-10-CM | POA: Diagnosis not present

## 2022-07-15 DIAGNOSIS — I214 Non-ST elevation (NSTEMI) myocardial infarction: Secondary | ICD-10-CM | POA: Diagnosis not present

## 2022-07-15 DIAGNOSIS — Z951 Presence of aortocoronary bypass graft: Secondary | ICD-10-CM | POA: Diagnosis not present

## 2022-07-26 DIAGNOSIS — Z0000 Encounter for general adult medical examination without abnormal findings: Secondary | ICD-10-CM | POA: Diagnosis not present

## 2022-07-26 DIAGNOSIS — E785 Hyperlipidemia, unspecified: Secondary | ICD-10-CM | POA: Diagnosis not present

## 2022-07-26 DIAGNOSIS — I25810 Atherosclerosis of coronary artery bypass graft(s) without angina pectoris: Secondary | ICD-10-CM | POA: Diagnosis not present

## 2022-07-26 DIAGNOSIS — I252 Old myocardial infarction: Secondary | ICD-10-CM | POA: Diagnosis not present

## 2022-07-26 DIAGNOSIS — G720 Drug-induced myopathy: Secondary | ICD-10-CM | POA: Diagnosis not present

## 2022-07-26 DIAGNOSIS — Z8673 Personal history of transient ischemic attack (TIA), and cerebral infarction without residual deficits: Secondary | ICD-10-CM | POA: Diagnosis not present

## 2022-07-26 DIAGNOSIS — I10 Essential (primary) hypertension: Secondary | ICD-10-CM | POA: Diagnosis not present

## 2022-07-26 DIAGNOSIS — E559 Vitamin D deficiency, unspecified: Secondary | ICD-10-CM | POA: Diagnosis not present

## 2022-07-26 DIAGNOSIS — T466X5A Adverse effect of antihyperlipidemic and antiarteriosclerotic drugs, initial encounter: Secondary | ICD-10-CM | POA: Diagnosis not present

## 2022-08-11 DIAGNOSIS — I10 Essential (primary) hypertension: Secondary | ICD-10-CM | POA: Diagnosis not present

## 2022-08-11 DIAGNOSIS — Z905 Acquired absence of kidney: Secondary | ICD-10-CM | POA: Diagnosis not present

## 2022-08-11 DIAGNOSIS — N1832 Chronic kidney disease, stage 3b: Secondary | ICD-10-CM | POA: Diagnosis not present

## 2022-08-11 DIAGNOSIS — I25810 Atherosclerosis of coronary artery bypass graft(s) without angina pectoris: Secondary | ICD-10-CM | POA: Diagnosis not present

## 2022-08-18 DIAGNOSIS — C7A8 Other malignant neuroendocrine tumors: Secondary | ICD-10-CM | POA: Diagnosis not present

## 2022-08-23 DIAGNOSIS — C7A8 Other malignant neuroendocrine tumors: Secondary | ICD-10-CM | POA: Diagnosis not present

## 2022-08-23 DIAGNOSIS — I10 Essential (primary) hypertension: Secondary | ICD-10-CM | POA: Diagnosis not present

## 2022-09-21 DIAGNOSIS — J301 Allergic rhinitis due to pollen: Secondary | ICD-10-CM | POA: Diagnosis not present

## 2022-09-23 DIAGNOSIS — H524 Presbyopia: Secondary | ICD-10-CM | POA: Diagnosis not present

## 2022-09-23 DIAGNOSIS — H2513 Age-related nuclear cataract, bilateral: Secondary | ICD-10-CM | POA: Diagnosis not present

## 2022-10-24 DIAGNOSIS — M791 Myalgia, unspecified site: Secondary | ICD-10-CM | POA: Diagnosis not present

## 2022-10-24 DIAGNOSIS — E559 Vitamin D deficiency, unspecified: Secondary | ICD-10-CM | POA: Diagnosis not present

## 2022-10-24 DIAGNOSIS — I10 Essential (primary) hypertension: Secondary | ICD-10-CM | POA: Diagnosis not present

## 2022-10-24 DIAGNOSIS — N1832 Chronic kidney disease, stage 3b: Secondary | ICD-10-CM | POA: Diagnosis not present

## 2022-10-24 DIAGNOSIS — E785 Hyperlipidemia, unspecified: Secondary | ICD-10-CM | POA: Diagnosis not present

## 2022-10-25 DIAGNOSIS — D3A8 Other benign neuroendocrine tumors: Secondary | ICD-10-CM | POA: Diagnosis not present

## 2022-10-25 DIAGNOSIS — I10 Essential (primary) hypertension: Secondary | ICD-10-CM | POA: Diagnosis not present

## 2022-10-25 DIAGNOSIS — K279 Peptic ulcer, site unspecified, unspecified as acute or chronic, without hemorrhage or perforation: Secondary | ICD-10-CM | POA: Diagnosis not present

## 2022-11-03 DIAGNOSIS — I10 Essential (primary) hypertension: Secondary | ICD-10-CM | POA: Diagnosis not present

## 2022-11-03 DIAGNOSIS — C7A8 Other malignant neuroendocrine tumors: Secondary | ICD-10-CM | POA: Diagnosis not present

## 2022-11-09 DIAGNOSIS — Z905 Acquired absence of kidney: Secondary | ICD-10-CM | POA: Diagnosis not present

## 2022-11-09 DIAGNOSIS — N281 Cyst of kidney, acquired: Secondary | ICD-10-CM | POA: Diagnosis not present

## 2022-11-09 DIAGNOSIS — I2510 Atherosclerotic heart disease of native coronary artery without angina pectoris: Secondary | ICD-10-CM | POA: Diagnosis not present

## 2022-11-09 DIAGNOSIS — C7A8 Other malignant neuroendocrine tumors: Secondary | ICD-10-CM | POA: Diagnosis not present

## 2022-11-09 DIAGNOSIS — Z951 Presence of aortocoronary bypass graft: Secondary | ICD-10-CM | POA: Diagnosis not present

## 2022-11-09 DIAGNOSIS — Z85028 Personal history of other malignant neoplasm of stomach: Secondary | ICD-10-CM | POA: Diagnosis not present

## 2022-11-09 DIAGNOSIS — R918 Other nonspecific abnormal finding of lung field: Secondary | ICD-10-CM | POA: Diagnosis not present

## 2022-11-16 DIAGNOSIS — H903 Sensorineural hearing loss, bilateral: Secondary | ICD-10-CM | POA: Diagnosis not present

## 2022-11-16 DIAGNOSIS — J3489 Other specified disorders of nose and nasal sinuses: Secondary | ICD-10-CM | POA: Diagnosis not present

## 2022-11-16 DIAGNOSIS — J342 Deviated nasal septum: Secondary | ICD-10-CM | POA: Diagnosis not present

## 2022-12-08 DIAGNOSIS — I10 Essential (primary) hypertension: Secondary | ICD-10-CM | POA: Diagnosis not present

## 2022-12-08 DIAGNOSIS — I214 Non-ST elevation (NSTEMI) myocardial infarction: Secondary | ICD-10-CM | POA: Diagnosis not present

## 2022-12-08 DIAGNOSIS — Z789 Other specified health status: Secondary | ICD-10-CM | POA: Diagnosis not present

## 2022-12-08 DIAGNOSIS — Z951 Presence of aortocoronary bypass graft: Secondary | ICD-10-CM | POA: Diagnosis not present

## 2022-12-08 DIAGNOSIS — I2510 Atherosclerotic heart disease of native coronary artery without angina pectoris: Secondary | ICD-10-CM | POA: Diagnosis not present

## 2022-12-14 DIAGNOSIS — Z905 Acquired absence of kidney: Secondary | ICD-10-CM | POA: Diagnosis not present

## 2022-12-14 DIAGNOSIS — N1832 Chronic kidney disease, stage 3b: Secondary | ICD-10-CM | POA: Diagnosis not present

## 2022-12-23 DIAGNOSIS — I252 Old myocardial infarction: Secondary | ICD-10-CM | POA: Diagnosis not present

## 2022-12-23 DIAGNOSIS — N281 Cyst of kidney, acquired: Secondary | ICD-10-CM | POA: Diagnosis not present

## 2022-12-23 DIAGNOSIS — D3A8 Other benign neuroendocrine tumors: Secondary | ICD-10-CM | POA: Diagnosis not present

## 2022-12-23 DIAGNOSIS — E669 Obesity, unspecified: Secondary | ICD-10-CM | POA: Diagnosis not present

## 2022-12-23 DIAGNOSIS — F411 Generalized anxiety disorder: Secondary | ICD-10-CM | POA: Diagnosis not present

## 2022-12-23 DIAGNOSIS — K7689 Other specified diseases of liver: Secondary | ICD-10-CM | POA: Diagnosis not present

## 2022-12-23 DIAGNOSIS — N1832 Chronic kidney disease, stage 3b: Secondary | ICD-10-CM | POA: Diagnosis not present

## 2022-12-23 DIAGNOSIS — K862 Cyst of pancreas: Secondary | ICD-10-CM | POA: Diagnosis not present

## 2022-12-23 DIAGNOSIS — Z8673 Personal history of transient ischemic attack (TIA), and cerebral infarction without residual deficits: Secondary | ICD-10-CM | POA: Diagnosis not present

## 2022-12-23 DIAGNOSIS — Z79899 Other long term (current) drug therapy: Secondary | ICD-10-CM | POA: Diagnosis not present

## 2022-12-23 DIAGNOSIS — K2970 Gastritis, unspecified, without bleeding: Secondary | ICD-10-CM | POA: Diagnosis not present

## 2022-12-23 DIAGNOSIS — I2510 Atherosclerotic heart disease of native coronary artery without angina pectoris: Secondary | ICD-10-CM | POA: Diagnosis not present

## 2022-12-23 DIAGNOSIS — I129 Hypertensive chronic kidney disease with stage 1 through stage 4 chronic kidney disease, or unspecified chronic kidney disease: Secondary | ICD-10-CM | POA: Diagnosis not present

## 2022-12-23 DIAGNOSIS — Z7902 Long term (current) use of antithrombotics/antiplatelets: Secondary | ICD-10-CM | POA: Diagnosis not present

## 2022-12-23 DIAGNOSIS — Z09 Encounter for follow-up examination after completed treatment for conditions other than malignant neoplasm: Secondary | ICD-10-CM | POA: Diagnosis not present

## 2022-12-23 DIAGNOSIS — E785 Hyperlipidemia, unspecified: Secondary | ICD-10-CM | POA: Diagnosis not present

## 2022-12-23 DIAGNOSIS — Z86018 Personal history of other benign neoplasm: Secondary | ICD-10-CM | POA: Diagnosis not present

## 2023-01-17 DIAGNOSIS — E785 Hyperlipidemia, unspecified: Secondary | ICD-10-CM | POA: Diagnosis not present

## 2023-01-17 DIAGNOSIS — I2510 Atherosclerotic heart disease of native coronary artery without angina pectoris: Secondary | ICD-10-CM | POA: Diagnosis not present

## 2023-01-17 DIAGNOSIS — I214 Non-ST elevation (NSTEMI) myocardial infarction: Secondary | ICD-10-CM | POA: Diagnosis not present

## 2023-01-17 DIAGNOSIS — Z951 Presence of aortocoronary bypass graft: Secondary | ICD-10-CM | POA: Diagnosis not present

## 2023-01-17 DIAGNOSIS — I10 Essential (primary) hypertension: Secondary | ICD-10-CM | POA: Diagnosis not present

## 2023-02-03 DIAGNOSIS — M25561 Pain in right knee: Secondary | ICD-10-CM | POA: Diagnosis not present

## 2023-02-03 DIAGNOSIS — M1711 Unilateral primary osteoarthritis, right knee: Secondary | ICD-10-CM | POA: Diagnosis not present

## 2023-02-07 DIAGNOSIS — C7A8 Other malignant neuroendocrine tumors: Secondary | ICD-10-CM | POA: Diagnosis not present

## 2023-03-17 DIAGNOSIS — I10 Essential (primary) hypertension: Secondary | ICD-10-CM | POA: Diagnosis not present

## 2023-03-17 DIAGNOSIS — R140 Abdominal distension (gaseous): Secondary | ICD-10-CM | POA: Diagnosis not present

## 2023-03-17 DIAGNOSIS — K5900 Constipation, unspecified: Secondary | ICD-10-CM | POA: Diagnosis not present

## 2023-03-17 DIAGNOSIS — R1031 Right lower quadrant pain: Secondary | ICD-10-CM | POA: Diagnosis not present

## 2023-04-05 DIAGNOSIS — K3189 Other diseases of stomach and duodenum: Secondary | ICD-10-CM | POA: Diagnosis not present

## 2023-04-05 DIAGNOSIS — K259 Gastric ulcer, unspecified as acute or chronic, without hemorrhage or perforation: Secondary | ICD-10-CM | POA: Diagnosis not present

## 2023-04-05 DIAGNOSIS — R1031 Right lower quadrant pain: Secondary | ICD-10-CM | POA: Diagnosis not present

## 2023-04-05 DIAGNOSIS — K862 Cyst of pancreas: Secondary | ICD-10-CM | POA: Diagnosis not present

## 2023-04-05 DIAGNOSIS — I10 Essential (primary) hypertension: Secondary | ICD-10-CM | POA: Diagnosis not present

## 2023-05-03 DIAGNOSIS — Z1330 Encounter for screening examination for mental health and behavioral disorders, unspecified: Secondary | ICD-10-CM | POA: Diagnosis not present

## 2023-05-03 DIAGNOSIS — R5383 Other fatigue: Secondary | ICD-10-CM | POA: Diagnosis not present

## 2023-05-03 DIAGNOSIS — C7A8 Other malignant neuroendocrine tumors: Secondary | ICD-10-CM | POA: Diagnosis not present

## 2023-05-03 DIAGNOSIS — R6889 Other general symptoms and signs: Secondary | ICD-10-CM | POA: Diagnosis not present

## 2023-05-03 DIAGNOSIS — R7301 Impaired fasting glucose: Secondary | ICD-10-CM | POA: Diagnosis not present

## 2023-05-03 DIAGNOSIS — N1832 Chronic kidney disease, stage 3b: Secondary | ICD-10-CM | POA: Diagnosis not present

## 2023-05-03 DIAGNOSIS — I252 Old myocardial infarction: Secondary | ICD-10-CM | POA: Diagnosis not present

## 2023-05-03 DIAGNOSIS — Z23 Encounter for immunization: Secondary | ICD-10-CM | POA: Diagnosis not present

## 2023-05-03 DIAGNOSIS — Z125 Encounter for screening for malignant neoplasm of prostate: Secondary | ICD-10-CM | POA: Diagnosis not present

## 2023-05-03 DIAGNOSIS — I10 Essential (primary) hypertension: Secondary | ICD-10-CM | POA: Diagnosis not present

## 2023-05-03 DIAGNOSIS — R911 Solitary pulmonary nodule: Secondary | ICD-10-CM | POA: Diagnosis not present

## 2023-05-03 DIAGNOSIS — E559 Vitamin D deficiency, unspecified: Secondary | ICD-10-CM | POA: Diagnosis not present

## 2023-05-08 DIAGNOSIS — I10 Essential (primary) hypertension: Secondary | ICD-10-CM | POA: Diagnosis not present

## 2023-05-08 DIAGNOSIS — N1832 Chronic kidney disease, stage 3b: Secondary | ICD-10-CM | POA: Diagnosis not present

## 2023-05-08 DIAGNOSIS — I25810 Atherosclerosis of coronary artery bypass graft(s) without angina pectoris: Secondary | ICD-10-CM | POA: Diagnosis not present

## 2023-06-06 DIAGNOSIS — I10 Essential (primary) hypertension: Secondary | ICD-10-CM | POA: Diagnosis not present

## 2023-06-06 DIAGNOSIS — R066 Hiccough: Secondary | ICD-10-CM | POA: Diagnosis not present

## 2023-06-06 DIAGNOSIS — R1031 Right lower quadrant pain: Secondary | ICD-10-CM | POA: Diagnosis not present

## 2023-06-06 DIAGNOSIS — R198 Other specified symptoms and signs involving the digestive system and abdomen: Secondary | ICD-10-CM | POA: Diagnosis not present

## 2023-06-06 DIAGNOSIS — R140 Abdominal distension (gaseous): Secondary | ICD-10-CM | POA: Diagnosis not present

## 2023-09-02 IMAGING — DX DG KNEE COMPLETE 4+V*L*
4 series · 4 of 4 positions shown · non-contrast
Comparison: None Available.

CLINICAL DATA: Left knee pain since yesterday, atraumatic

EXAM:
LEFT KNEE - COMPLETE 4+ VIEW

[knee ap]
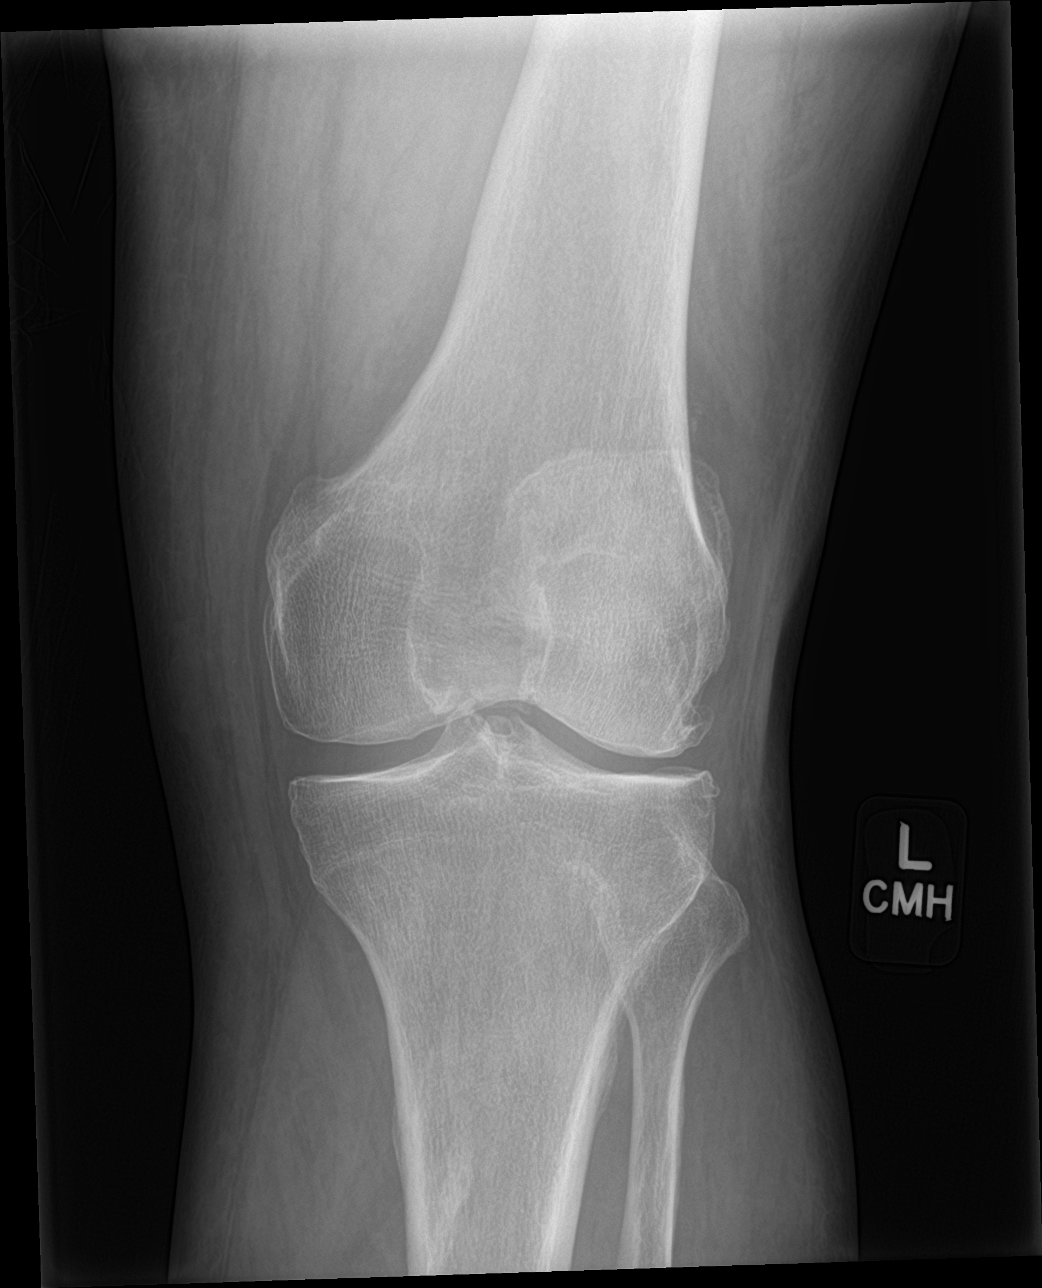

[knee obl (1 of 2)]
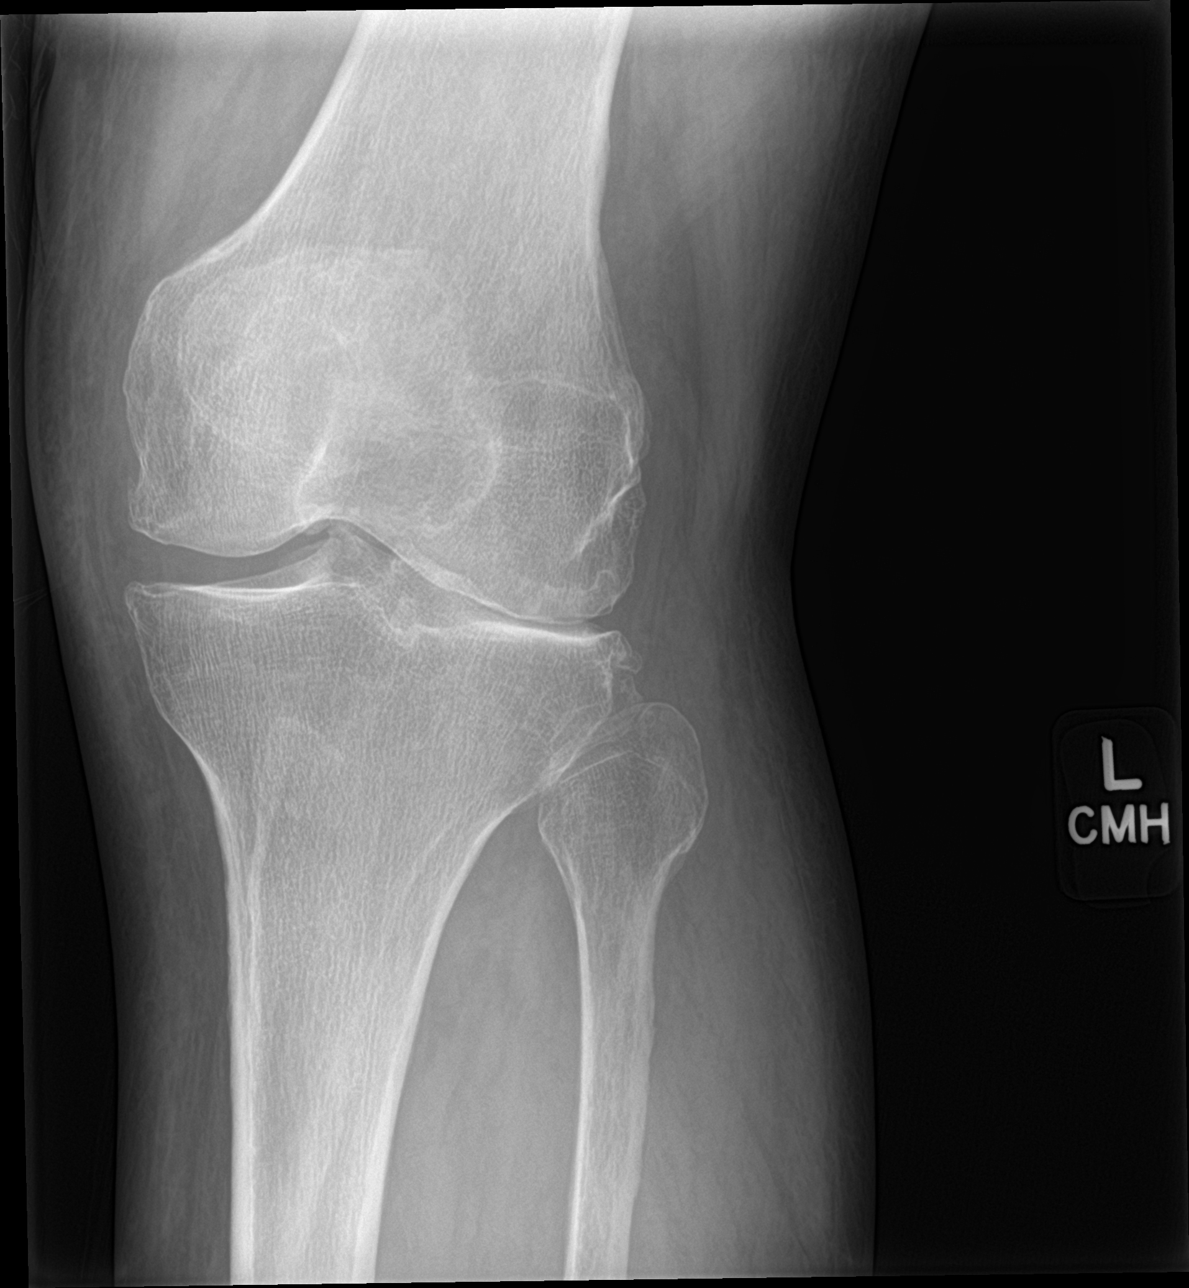

[knee obl (2 of 2)]
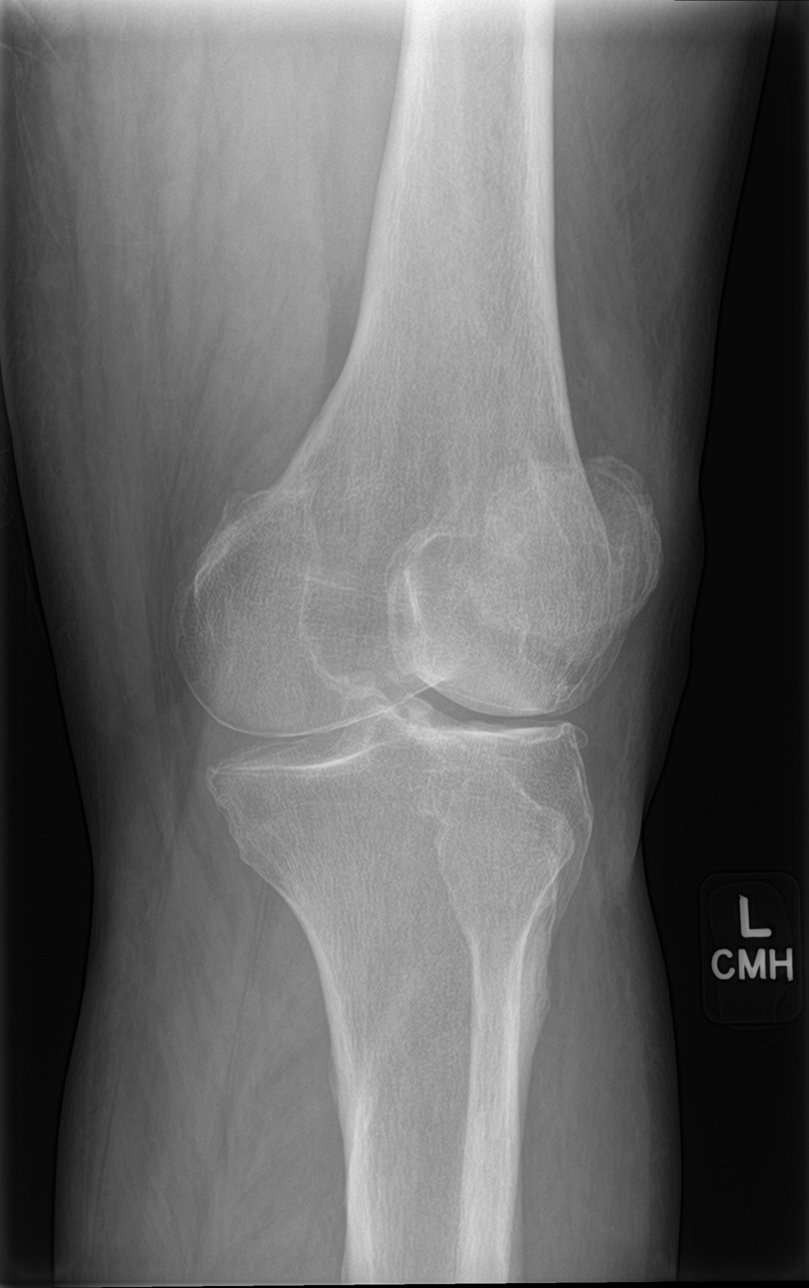

[knee lat]
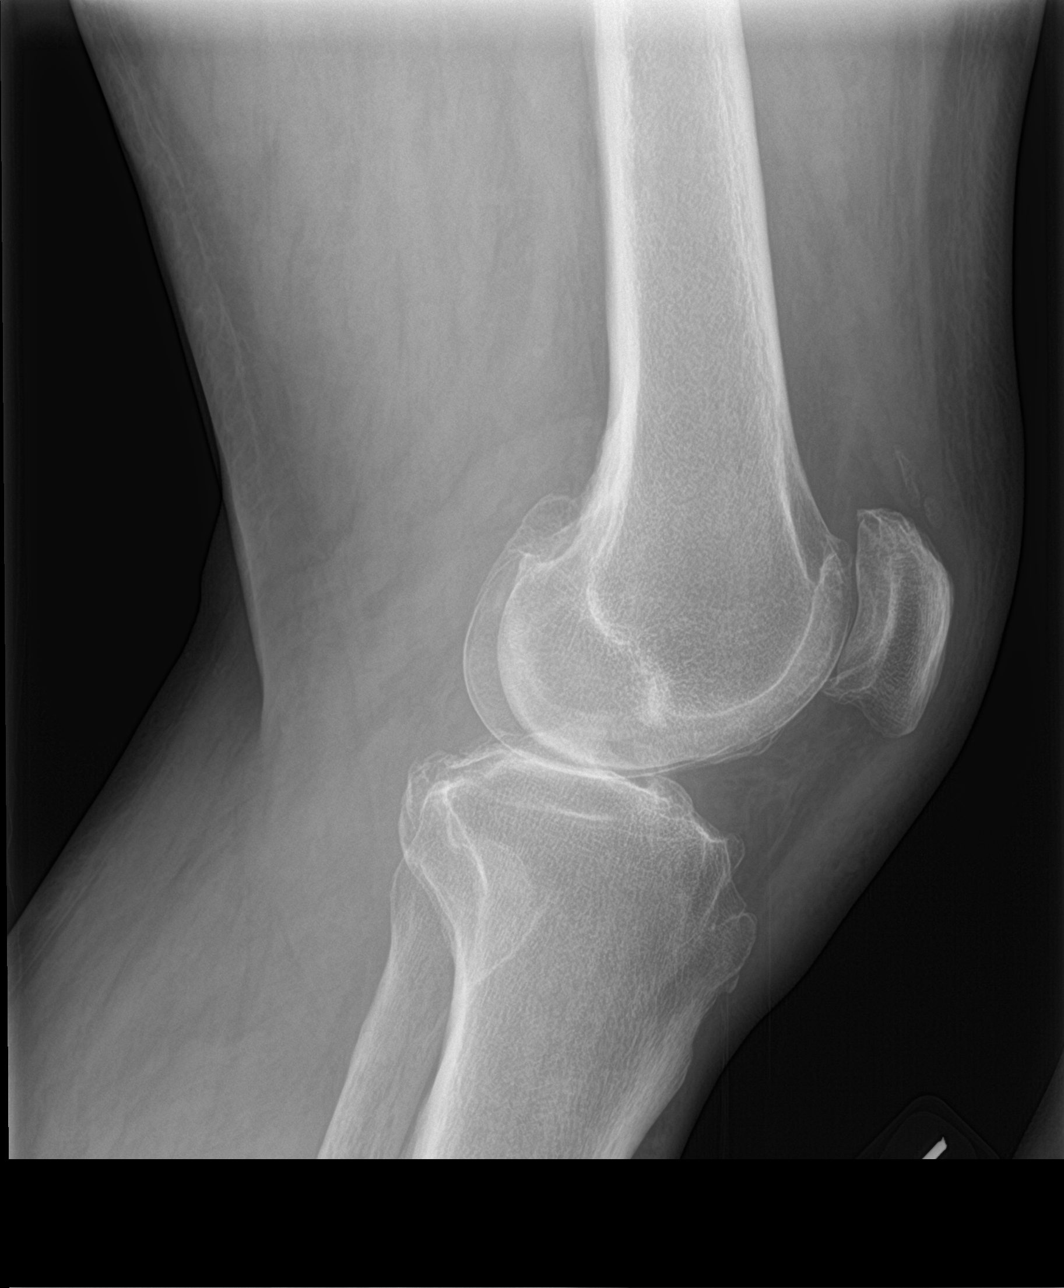

[4 of 4 positions shown; findings below may reference images not displayed]

FINDINGS: No evidence of fracture, dislocation, or joint effusion.
Tricompartmental osteoarthritic spurring greatest at the lateral and
patellofemoral compartments.
IMPRESSION: 1. No acute finding.
2. Tricompartmental osteoarthritis.

## 2024-03-06 ENCOUNTER — Other Ambulatory Visit: Payer: Self-pay

## 2024-03-06 ENCOUNTER — Emergency Department (HOSPITAL_COMMUNITY)

## 2024-03-06 ENCOUNTER — Emergency Department (HOSPITAL_COMMUNITY)
Admission: EM | Admit: 2024-03-06 | Discharge: 2024-03-06 | Disposition: A | Discharge status: Home / Self Care | Attending: Emergency Medicine | Admitting: Emergency Medicine

## 2024-03-06 ENCOUNTER — Encounter (HOSPITAL_COMMUNITY): Payer: Self-pay

## 2024-03-06 DIAGNOSIS — Z7982 Long term (current) use of aspirin: Secondary | ICD-10-CM | POA: Insufficient documentation

## 2024-03-06 DIAGNOSIS — I2510 Atherosclerotic heart disease of native coronary artery without angina pectoris: Secondary | ICD-10-CM | POA: Insufficient documentation

## 2024-03-06 DIAGNOSIS — Z79899 Other long term (current) drug therapy: Secondary | ICD-10-CM | POA: Insufficient documentation

## 2024-03-06 DIAGNOSIS — I10 Essential (primary) hypertension: Secondary | ICD-10-CM

## 2024-03-06 DIAGNOSIS — N189 Chronic kidney disease, unspecified: Secondary | ICD-10-CM | POA: Insufficient documentation

## 2024-03-06 DIAGNOSIS — I129 Hypertensive chronic kidney disease with stage 1 through stage 4 chronic kidney disease, or unspecified chronic kidney disease: Secondary | ICD-10-CM | POA: Insufficient documentation

## 2024-03-06 DIAGNOSIS — R42 Dizziness and giddiness: Secondary | ICD-10-CM

## 2024-03-06 DIAGNOSIS — R55 Syncope and collapse: Secondary | ICD-10-CM | POA: Insufficient documentation

## 2024-03-06 LAB — CBC WITH DIFFERENTIAL/PLATELET
Abs Immature Granulocytes: 0.01 K/uL (ref 0.00–0.07)
Basophils Absolute: 0 K/uL (ref 0.0–0.1)
Basophils Relative: 1 %
Eosinophils Absolute: 0.2 K/uL (ref 0.0–0.5)
Eosinophils Relative: 3 %
HCT: 43.4 % (ref 39.0–52.0)
Hemoglobin: 15 g/dL (ref 13.0–17.0)
Immature Granulocytes: 0 %
Lymphocytes Relative: 30 %
Lymphs Abs: 1.6 K/uL (ref 0.7–4.0)
MCH: 32.8 pg (ref 26.0–34.0)
MCHC: 34.6 g/dL (ref 30.0–36.0)
MCV: 95 fL (ref 80.0–100.0)
Monocytes Absolute: 0.6 K/uL (ref 0.1–1.0)
Monocytes Relative: 11 %
Neutro Abs: 2.9 K/uL (ref 1.7–7.7)
Neutrophils Relative %: 55 %
Platelets: 128 K/uL — ABNORMAL LOW (ref 150–400)
RBC: 4.57 MIL/uL (ref 4.22–5.81)
RDW: 13.3 % (ref 11.5–15.5)
WBC: 5.2 K/uL (ref 4.0–10.5)
nRBC: 0 % (ref 0.0–0.2)

## 2024-03-06 LAB — COMPREHENSIVE METABOLIC PANEL WITH GFR
ALT: 18 U/L (ref 0–44)
AST: 19 U/L (ref 15–41)
Albumin: 4.2 g/dL (ref 3.5–5.0)
Alkaline Phosphatase: 63 U/L (ref 38–126)
Anion gap: 9 (ref 5–15)
BUN: 25 mg/dL — ABNORMAL HIGH (ref 8–23)
CO2: 25 mmol/L (ref 22–32)
Calcium: 9 mg/dL (ref 8.9–10.3)
Chloride: 106 mmol/L (ref 98–111)
Creatinine, Ser: 1.94 mg/dL — ABNORMAL HIGH (ref 0.61–1.24)
GFR, Estimated: 37 mL/min — ABNORMAL LOW (ref >60)
Glucose, Bld: 114 mg/dL — ABNORMAL HIGH (ref 70–99)
Potassium: 4.4 mmol/L (ref 3.5–5.1)
Sodium: 140 mmol/L (ref 135–145)
Total Bilirubin: 0.5 mg/dL (ref 0.0–1.2)
Total Protein: 6.5 g/dL (ref 6.5–8.1)

## 2024-03-06 LAB — CBG MONITORING, ED: Glucose-Capillary: 104 mg/dL — ABNORMAL HIGH (ref 70–99)

## 2024-03-06 LAB — MAGNESIUM: Magnesium: 2.3 mg/dL (ref 1.7–2.4)

## 2024-03-06 LAB — TSH: TSH: 1.47 u[IU]/mL (ref 0.350–4.500)

## 2024-03-06 LAB — PRO BRAIN NATRIURETIC PEPTIDE: Pro Brain Natriuretic Peptide: 1030 pg/mL — ABNORMAL HIGH (ref <300.0)

## 2024-03-06 LAB — TROPONIN T, HIGH SENSITIVITY
Troponin T High Sensitivity: 48 ng/L — ABNORMAL HIGH (ref 0–19)
Troponin T High Sensitivity: 48 ng/L — ABNORMAL HIGH (ref 0–19)

## 2024-03-06 MED ORDER — MECLIZINE HCL 12.5 MG PO TABS
25.0000 mg | ORAL_TABLET | Freq: Once | ORAL | Status: AC
  Administered 2024-03-06: 25 mg via ORAL
  Filled 2024-03-06: qty 2

## 2024-03-06 MED ORDER — MECLIZINE HCL 25 MG PO TABS: 25.0000 mg | ORAL_TABLET | Freq: Three times a day (TID) | ORAL | 0 refills | Status: AC | PRN

## 2024-03-06 MED ORDER — LORAZEPAM 2 MG/ML IJ SOLN
1.0000 mg | Freq: Once | INTRAMUSCULAR | Status: AC
  Administered 2024-03-06: 1 mg via INTRAVENOUS
  Filled 2024-03-06: qty 1

## 2024-03-06 MED ORDER — LACTATED RINGERS IV BOLUS
500.0000 mL | Freq: Once | INTRAVENOUS | Status: AC
  Administered 2024-03-06: 500 mL via INTRAVENOUS

## 2024-03-06 NOTE — Discharge Instructions (Addendum)
 Thank you for coming to [facility] Emergency Department. You were seen for dizziness. We did an exam, labs, and imaging, and these showed no acute findings.  Please follow-up with her primary care physician tomorrow as originally scheduled.  You can take meclizine 25 mg up to 3 times a day as needed for dizziness.  Please stay well-hydrated at home.  Please take your blood pressure once a day in the afternoon and record this value.   Do not hesitate to return to the ED or call 911 if you experience: -Worsening symptoms -Numbness/tingling, asymmetric weakness, slurred speech, word-finding difficulty, facial droop, or confusion -Severe headache -Lightheadedness, passing out -Fevers/chills -Anything else that concerns you

## 2024-03-06 NOTE — ED Notes (Signed)
 Patient transported to CT

## 2024-03-06 NOTE — ED Provider Notes (Signed)
 7:28 AM Assumed care of patient from off-going team. For more details, please see note from same day.  In brief, this is a 68 y.o. male p/w dizziness this AM. At baseline he does have positional dizziness but his AM  was worse, had near syncope. Orthostatic/normotensive. Gave him 500 cc LR. On multiple BP medicines, hasn't taken any today. Also given meclizine 25 mg.   Plan/Dispo at time of sign-out & ED Course since sign-out: CTH, EKG, reassess  BP (!) 168/93   Pulse 68   Temp 98.2 F (36.8 C) (Oral)   Resp 18   SpO2 99%    ED Course:   Clinical Course as of 03/06/24 0937  Wed Mar 06, 2024  0803 CT HEAD WO CONTRAST No acute findings. [HN]  [id number] Patient reevaluated.  His workup is very reassuring.  He has mildly elevated troponin consistent with prior troponins and history of CKD.  He is mildly hypertensive but improved from before.  He states that his symptoms really improved after he was given meclizine earlier and he feels 100% better now.  He states that he already has a follow-up appointment with his primary care physician scheduled for tomorrow.  He also states that sometimes it seems like the dizziness is preceded by a headache.  He has had worsening headaches over the last several months.  CT head today NAICP.  He denies any symptoms currently, no focal neurodeficits.  He does not feel dizzy anymore.  I discussed with the patient about potentially following up with cardiology or PCP for his blood pressure and also discussing the dizziness and headaches tomorrow with his PCP for consideration for referral to a headache specialist.  Patient is given discharge instructions and return precautions, all questions answered to patient satisfaction. [HN]    Clinical Course User Index [HN] Franklyn Sid SAILOR, MD    Dispo: DC ------------------------------- Sid Franklyn, MD Emergency Medicine  This note was created using dictation software, which may contain spelling or grammatical  errors.   Franklyn Sid SAILOR, MD 03/06/24 [phone]

## 2024-03-06 NOTE — ED Triage Notes (Signed)
 Pt via [facility] c/o dizziness when waking this morning. Pt hypertensive 204/84 with EMS.  18 R AC

## 2024-03-06 NOTE — ED Provider Notes (Signed)
 [place] EMERGENCY DEPARTMENT AT [facility] Provider Note   CSN: [id number] Arrival date & time: 03/06/24  9383     Patient presents with: No chief complaint on file.   Erik [name] is a 68 y.o. male.   HPI Patient presents for near syncope.  Medical history includes HTN, CAD, HLD, CKD.  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  He has been undergoing recent medication changes, particularly with his blood pressure medication.  He states that he typically has a mild dizziness throughout the day.  He will get intermittent headaches.  Yesterday, he had a headache that was similar to his chronic headaches.  He developed worsened dizziness last night.  EMS was called to his home.  He felt better and declined transport at the time.  This morning he woke up feeling well.  Shortly after waking up, while walking from the bathroom, he developed recurrence of worsened dizziness.  He felt like he was going to pass out.  He laid down on his bed and felt like he was short of breath.  EMS was called and brought him to the ED.  With EMS, he was placed on oxygen for comfort.  They did note hypertension prior to arrival.  Patient has not yet taken any of his morning medications.  Currently, he has very mild dizziness.  He denies any other current symptoms.     Prior to Admission medications   Medication Sig Start Date End Date Taking? Authorizing Provider  acetaminophen  (TYLENOL ) 500 MG tablet Take 500-1,000 mg by mouth every 6 (six) hours as needed (for arthritic pain).    [provider]  amLODipine  (NORVASC ) 10 MG tablet Take 10 mg by mouth as directed.    [provider]  aspirin  EC 81 MG tablet Take 1 tablet (81 mg total) by mouth daily. 10/30/17   Lelon Hamilton T, PA-C  Cholecalciferol (VITAMIN D3) 2000 units capsule Take 2,000 Units by mouth daily.    [provider]  clopidogrel  (PLAVIX ) 75 MG tablet Take 75 mg by mouth daily. 07/31/15    [provider]  Evolocumab  (REPATHA  SURECLICK) 140 MG/ML SOAJ Inject 1 pen into the skin every 14 (fourteen) days. 07/07/20   Nishan, Peter C, MD  famotidine (PEPCID) 40 MG tablet Take 1 tablet by mouth 2 (two) times daily. 04/27/21   [provider]  fenofibrate  54 MG tablet Take 54 mg by mouth daily. 04/29/21   [provider]  furosemide  (LASIX ) 20 MG tablet Take 1 tablet (20 mg total) by mouth as needed. 10/16/20   Parthenia Olivia HERO, PA-C  icosapent  Ethyl (VASCEPA ) 1 g capsule Take 2 capsules (2 g total) by mouth 2 (two) times daily. 04/02/20   Nishan, Peter C, MD  isosorbide  mononitrate (IMDUR ) 30 MG 24 hr tablet Take 1 tablet (30 mg total) by mouth daily. 10/10/18   Delford Maude BROCKS, MD  lisinopril  (PRINIVIL ,ZESTRIL ) 5 MG tablet Take 1 tablet (5 mg total) by mouth daily. 04/16/16   Rai, Nydia POUR, MD  metoprolol  succinate (TOPROL -XL) 100 MG 24 hr tablet Take 100 mg by mouth daily. 06/02/16   [provider]  nitroGLYCERIN  (NITROSTAT ) 0.4 MG SL tablet Place 1 tablet (0.4 mg total) under the tongue every 5 (five) minutes as needed for chest pain. 01/10/19   Nishan, Peter C, MD  vitamin B-12 (CYANOCOBALAMIN ) 250 MCG tablet Take 250 mcg by mouth daily.     [provider]  Allergies: Rosuvastatin and Zetia [ezetimibe]    Review of Systems  Respiratory:  Positive for shortness of breath.   Neurological:  Positive for dizziness, light-headedness and headaches.  All other systems reviewed and are negative.   Updated Vital Signs BP (!) 168/93   Pulse 68   Temp 98.2 F (36.8 C) (Oral)   Resp 18   SpO2 99%   Physical Exam Vitals and nursing note reviewed.  Constitutional:      General: He is not in acute distress.    Appearance: Normal appearance. He is well-developed. He is not ill-appearing, toxic-appearing or diaphoretic.  HENT:     Head: Normocephalic and atraumatic.     Right Ear: External ear normal.     Left Ear: External ear normal.      Nose: Nose normal.     Mouth/Throat:     Mouth: Mucous membranes are moist.  Eyes:     Extraocular Movements: Extraocular movements intact.     Conjunctiva/sclera: Conjunctivae normal.  Cardiovascular:     Rate and Rhythm: Normal rate and regular rhythm.  Pulmonary:     Effort: Pulmonary effort is normal. No respiratory distress.  Abdominal:     General: There is no distension.     Palpations: Abdomen is soft.  Musculoskeletal:        General: No swelling or deformity. Normal range of motion.     Cervical back: Normal range of motion and neck supple.  Skin:    General: Skin is warm and dry.     Coloration: Skin is not jaundiced or pale.  Neurological:     General: No focal deficit present.     Mental Status: He is alert and oriented to person, place, and time.     Cranial Nerves: No cranial nerve deficit.     Sensory: No sensory deficit.     Motor: No weakness.     Coordination: Coordination normal.  Psychiatric:        Mood and Affect: Mood normal.        Behavior: Behavior normal.     (all labs ordered are listed, but only abnormal results are displayed) Labs Reviewed  COMPREHENSIVE METABOLIC PANEL WITH GFR - Abnormal; Notable for the following components:      Result Value   Glucose, Bld 114 (*)    BUN 25 (*)    Creatinine, Ser 1.94 (*)    GFR, Estimated 37 (*)    All other components within normal limits  CBC WITH DIFFERENTIAL/PLATELET - Abnormal; Notable for the following components:   Platelets 128 (*)    All other components within normal limits  PRO BRAIN NATRIURETIC PEPTIDE - Abnormal; Notable for the following components:   Pro Brain Natriuretic Peptide 1,030.0 (*)    All other components within normal limits  CBG MONITORING, ED - Abnormal; Notable for the following components:   Glucose-Capillary 104 (*)    All other components within normal limits  TROPONIN T, HIGH SENSITIVITY - Abnormal; Notable for the following components:   Troponin T High  Sensitivity 48 (*)    All other components within normal limits  MAGNESIUM   TSH    EKG: None  Radiology: [facility] Chest Port 1 View Result Date: 03/06/2024 CLINICAL DATA:  Disease yes with hypertension.  Near syncope. EXAM: PORTABLE CHEST 1 VIEW COMPARISON:  04/13/2016 FINDINGS: The cardio pericardial silhouette is enlarged. Similar asymmetric elevation right hemidiaphragm. The lungs are clear without focal pneumonia, edema, pneumothorax or pleural effusion. Status post CABG.  Telemetry leads overlie the chest. IMPRESSION: No active disease. Electronically Signed   By: Camellia Candle M.D.   On: 03/06/2024 07:09     Procedures   Medications Ordered in the ED  lactated ringers bolus 500 mL (500 mLs Intravenous New Bag/Given 03/06/24 0657)  meclizine (ANTIVERT) tablet 25 mg (25 mg Oral Given 03/06/24 0655)  LORazepam (ATIVAN) injection 1 mg (1 mg Intravenous Given 03/06/24 0734)                                    Medical Decision Making Amount and/or Complexity of Data Reviewed Labs: ordered. Radiology: ordered.  Risk Prescription drug management.   This patient presents to the ED for concern of near syncope, this involves an extensive number of treatment options, and is a complaint that carries with it a high risk of complications and morbidity.  The differential diagnosis includes arrhythmia, vasovagal episode, orthostatic hypotension, hypertensive crisis, CVA, TIA, polypharmacy, vestibular neuritis, BPPV, valvular dysfunction   Co morbidities / Chronic conditions that complicate the patient evaluation  HTN, CAD, HLD, CKD   Additional history obtained:  Additional history obtained from EMR External records from outside source obtained and reviewed including N/A   Lab Tests:  I Ordered, and personally interpreted labs.  The pertinent results include:  Normal hemoglobin, no leukocytosis, baseline creatinine, baseline troponin.   Imaging Studies ordered:  I ordered  imaging studies including CXR, CT head  I independently visualized and interpreted imaging which showed (pending at time of signout) I agree with the radiologist interpretation   Cardiac Monitoring: / EKG:  The patient was maintained on a cardiac monitor.  I personally viewed and interpreted the cardiac monitored which showed an underlying rhythm of: sinus rhythm   Problem List / ED Course / Critical interventions / Medication management  Patient presenting for dizziness and near syncope.  On arrival in the ED, patient is well-appearing on exam.  He is hypertensive but has not yet taken his morning medications.  He states that he typically takes his first doses around 10 AM.  He has no focal neurologic deficits on exam.  When stood up, he does not experience any near syncopal symptoms.  His blood pressure does drop 50 points of SBP.  IV fluids and meclizine were ordered.  Workup was initiated.  Laboratory baseline creatinine at baseline troponin.  Care of patient signed out to oncoming ED provider. I ordered medication including fluids for hydration, meclizine for dizziness  Social Determinants of Health:  Lives independently     Final diagnoses:  Near syncope    ED Discharge Orders     None          Melvenia Motto, MD 03/06/24 [phone]
